# Patient Record
Sex: Male | Born: 1985 | Race: White | Hispanic: No | Marital: Married | Smoking: Never smoker
Health system: Southern US, Community
[De-identification: ages and names within clinical notes are randomized; demographics above are authoritative.]

## PROBLEM LIST (undated history)

## (undated) DIAGNOSIS — F329 Major depressive disorder, single episode, unspecified: Secondary | ICD-10-CM

## (undated) DIAGNOSIS — F32A Depression, unspecified: Secondary | ICD-10-CM

## (undated) HISTORY — DX: Morbid (severe) obesity due to excess calories: E66.01

## (undated) HISTORY — DX: Depression, unspecified: F32.A

## (undated) HISTORY — DX: Major depressive disorder, single episode, unspecified: F32.9

---
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
## 2012-12-11 ENCOUNTER — Emergency Department (HOSPITAL_COMMUNITY)
Admission: EM | Admit: 2012-12-11 | Discharge: 2012-12-11 | Disposition: A | Discharge status: Home / Self Care | Payer: Self-pay | Attending: Emergency Medicine | Admitting: Emergency Medicine

## 2012-12-11 ENCOUNTER — Encounter (HOSPITAL_COMMUNITY): Payer: Self-pay | Admitting: Emergency Medicine

## 2012-12-11 ENCOUNTER — Emergency Department (HOSPITAL_COMMUNITY): Payer: Self-pay

## 2012-12-11 DIAGNOSIS — S2239XA Fracture of one rib, unspecified side, initial encounter for closed fracture: Secondary | ICD-10-CM | POA: Insufficient documentation

## 2012-12-11 DIAGNOSIS — S2232XA Fracture of one rib, left side, initial encounter for closed fracture: Secondary | ICD-10-CM

## 2012-12-11 DIAGNOSIS — Y9389 Activity, other specified: Secondary | ICD-10-CM | POA: Insufficient documentation

## 2012-12-11 DIAGNOSIS — Y92410 Unspecified street and highway as the place of occurrence of the external cause: Secondary | ICD-10-CM | POA: Insufficient documentation

## 2012-12-11 MED ORDER — OXYCODONE-ACETAMINOPHEN 5-325 MG PO TABS: 1.0000 | ORAL_TABLET | Freq: Four times a day (QID) | ORAL | Status: DC | PRN

## 2012-12-11 MED ORDER — OXYCODONE-ACETAMINOPHEN 5-325 MG PO TABS
2.0000 | ORAL_TABLET | Freq: Once | ORAL | Status: AC
  Administered 2012-12-11: 2 via ORAL
  Filled 2012-12-11: qty 2
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
## 2012-12-12 NOTE — ED Provider Notes (Signed)
Medical screening examination/treatment/procedure(s) were performed by non-physician practitioner and as supervising physician I was immediately available for consultation/collaboration.   Kayla Deshaies E Autry Droege, MD 12/12/12 1409 

## 2014-03-22 ENCOUNTER — Emergency Department (HOSPITAL_COMMUNITY)
Admission: EM | Admit: 2014-03-22 | Discharge: 2014-03-22 | Disposition: A | Discharge status: Home / Self Care | Payer: Self-pay | Attending: Emergency Medicine | Admitting: Emergency Medicine

## 2014-03-22 ENCOUNTER — Encounter (HOSPITAL_COMMUNITY): Payer: Self-pay | Admitting: Emergency Medicine

## 2014-03-22 DIAGNOSIS — Y9389 Activity, other specified: Secondary | ICD-10-CM | POA: Insufficient documentation

## 2014-03-22 DIAGNOSIS — T2111XA Burn of first degree of chest wall, initial encounter: Secondary | ICD-10-CM | POA: Insufficient documentation

## 2014-03-22 DIAGNOSIS — T23009A Burn of unspecified degree of unspecified hand, unspecified site, initial encounter: Secondary | ICD-10-CM | POA: Insufficient documentation

## 2014-03-22 DIAGNOSIS — T23272A Burn of second degree of left wrist, initial encounter: Secondary | ICD-10-CM

## 2014-03-22 DIAGNOSIS — Y9289 Other specified places as the place of occurrence of the external cause: Secondary | ICD-10-CM | POA: Insufficient documentation

## 2014-03-22 DIAGNOSIS — T23279A Burn of second degree of unspecified wrist, initial encounter: Secondary | ICD-10-CM | POA: Insufficient documentation

## 2014-03-22 DIAGNOSIS — X131XXA Other contact with steam and other hot vapors, initial encounter: Secondary | ICD-10-CM

## 2014-03-22 DIAGNOSIS — Z79899 Other long term (current) drug therapy: Secondary | ICD-10-CM | POA: Insufficient documentation

## 2014-03-22 DIAGNOSIS — X12XXXA Contact with other hot fluids, initial encounter: Secondary | ICD-10-CM | POA: Insufficient documentation

## 2014-03-22 MED ORDER — OXYCODONE-ACETAMINOPHEN 5-325 MG PO TABS
1.0000 | ORAL_TABLET | Freq: Once | ORAL | Status: AC
  Administered 2014-03-22: 1 via ORAL
  Filled 2014-03-22: qty 1

## 2014-03-22 MED ORDER — SILVER SULFADIAZINE 1 % EX CREA
TOPICAL_CREAM | Freq: Once | CUTANEOUS | Status: AC
  Administered 2014-03-22: 1 via TOPICAL
  Filled 2014-03-22: qty 85

## 2014-03-22 MED ORDER — TETANUS-DIPHTH-ACELL PERTUSSIS 5-2.5-18.5 LF-MCG/0.5 IM SUSP
0.5000 mL | Freq: Once | INTRAMUSCULAR | Status: DC
  Filled 2014-03-22: qty 0.5

## 2014-03-22 MED ORDER — OXYCODONE-ACETAMINOPHEN 5-325 MG PO TABS: 1.0000 | ORAL_TABLET | Freq: Four times a day (QID) | ORAL | Status: DC | PRN

## 2014-03-22 MED ORDER — SILVER SULFADIAZINE 1 % EX CREA
1.0000 | TOPICAL_CREAM | Freq: Every day | CUTANEOUS | Status: DC
Start: 2014-03-22 — End: 2014-06-23

## 2014-03-22 MED ORDER — LIDOCAINE HCL 0.5 % EX GEL: CUTANEOUS | Status: DC

## 2014-03-22 MED ORDER — VITAMINS A & D EX OINT: 1.0000 "application " | TOPICAL_OINTMENT | CUTANEOUS | Status: DC | PRN
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
## 2014-03-23 NOTE — ED Provider Notes (Signed)
Medical screening examination/treatment/procedure(s) were performed by non-physician practitioner and as supervising physician I was immediately available for consultation/collaboration.   EKG Interpretation None        Elwin Mocha, MD 03/23/14 [phone]

## 2014-06-23 ENCOUNTER — Encounter (HOSPITAL_COMMUNITY): Payer: Self-pay | Admitting: *Deleted

## 2014-06-23 ENCOUNTER — Emergency Department (INDEPENDENT_AMBULATORY_CARE_PROVIDER_SITE_OTHER)
Admission: EM | Admit: 2014-06-23 | Discharge: 2014-06-23 | Disposition: A | Discharge status: Home / Self Care | Payer: Self-pay | Source: Home / Self Care | Attending: Emergency Medicine | Admitting: Emergency Medicine

## 2014-06-23 DIAGNOSIS — R0982 Postnasal drip: Secondary | ICD-10-CM

## 2014-06-23 DIAGNOSIS — J4520 Mild intermittent asthma, uncomplicated: Secondary | ICD-10-CM

## 2014-06-23 DIAGNOSIS — J069 Acute upper respiratory infection, unspecified: Secondary | ICD-10-CM

## 2014-06-23 MED ORDER — ALBUTEROL SULFATE (2.5 MG/3ML) 0.083% IN NEBU
INHALATION_SOLUTION | RESPIRATORY_TRACT | Status: AC
  Filled 2014-06-23: qty 3

## 2014-06-23 MED ORDER — TRIAMCINOLONE ACETONIDE 40 MG/ML IJ SUSP
60.0000 mg | Freq: Once | INTRAMUSCULAR | Status: AC
  Administered 2014-06-23: 60 mg via INTRAMUSCULAR

## 2014-06-23 MED ORDER — ALBUTEROL SULFATE HFA 108 (90 BASE) MCG/ACT IN AERS: 2.0000 | INHALATION_SPRAY | RESPIRATORY_TRACT | Status: DC | PRN

## 2014-06-23 MED ORDER — IPRATROPIUM-ALBUTEROL 0.5-2.5 (3) MG/3ML IN SOLN
RESPIRATORY_TRACT | Status: AC
  Filled 2014-06-23: qty 3

## 2014-06-23 MED ORDER — PREDNISONE 20 MG PO TABS: ORAL_TABLET | ORAL | Status: DC

## 2014-06-23 MED ORDER — IPRATROPIUM-ALBUTEROL 0.5-2.5 (3) MG/3ML IN SOLN
3.0000 mL | Freq: Once | RESPIRATORY_TRACT | Status: AC
  Administered 2014-06-23: 3 mL via RESPIRATORY_TRACT

## 2014-06-23 MED ORDER — ALBUTEROL SULFATE (2.5 MG/3ML) 0.083% IN NEBU
2.5000 mg | INHALATION_SOLUTION | Freq: Once | RESPIRATORY_TRACT | Status: AC
  Administered 2014-06-23: 2.5 mg via RESPIRATORY_TRACT

## 2014-06-23 MED ORDER — TRIAMCINOLONE ACETONIDE 40 MG/ML IJ SUSP
INTRAMUSCULAR | Status: AC
  Filled 2014-06-23: qty 2

## 2014-06-23 NOTE — Discharge Instructions (Signed)
Bronchospasm A bronchospasm is a spasm or tightening of the airways going into the lungs. During a bronchospasm breathing becomes more difficult because the airways get smaller. When this happens there can be coughing, a whistling sound when breathing (wheezing), and difficulty breathing. Bronchospasm is often associated with asthma, but not all patients who experience a bronchospasm have asthma. CAUSES  A bronchospasm is caused by inflammation or irritation of the airways. The inflammation or irritation may be triggered by:   Allergies (such as to animals, pollen, food, or mold). Allergens that cause bronchospasm may cause wheezing immediately after exposure or many hours later.   Infection. Viral infections are believed to be the most common cause of bronchospasm.   Exercise.   Irritants (such as pollution, cigarette smoke, strong odors, aerosol sprays, and paint fumes).   Weather changes. Winds increase molds and pollens in the air. Rain refreshes the air by washing irritants out. Cold air may cause inflammation.   Stress and emotional upset.  SIGNS AND SYMPTOMS   Wheezing.   Excessive nighttime coughing.   Frequent or severe coughing with a simple cold.   Chest tightness.   Shortness of breath.  DIAGNOSIS  Bronchospasm is usually diagnosed through a history and physical exam. Tests, such as chest X-rays, are sometimes done to look for other conditions. TREATMENT   Inhaled medicines can be given to open up your airways and help you breathe. The medicines can be given using either an inhaler or a nebulizer machine.  Corticosteroid medicines may be given for severe bronchospasm, usually when it is associated with asthma. HOME CARE INSTRUCTIONS   Always have a plan prepared for seeking medical care. Know when to call your health care provider and local emergency services (911 in the [place] Know where you can access local emergency care.  Only take medicines as  directed by your health care provider.  If you were prescribed an inhaler or nebulizer machine, ask your health care provider to explain how to use it correctly. Always use a spacer with your inhaler if you were given one.  It is necessary to remain calm during an attack. Try to relax and breathe more slowly.  Control your home environment in the following ways:   Change your heating and air conditioning filter at least once a month.   Limit your use of fireplaces and wood stoves.  Do not smoke and do not allow smoking in your home.   Avoid exposure to perfumes and fragrances.   Get rid of pests (such as roaches and mice) and their droppings.   Throw away plants if you see mold on them.   Keep your house clean and dust free.   Replace carpet with wood, tile, or vinyl flooring. Carpet can trap dander and dust.   Use allergy-proof pillows, mattress covers, and box spring covers.   Wash bed sheets and blankets every week in hot water and dry them in a dryer.   Use blankets that are made of polyester or cotton.   Wash hands frequently. SEEK MEDICAL CARE IF:   You have muscle aches.   You have chest pain.   The sputum changes from clear or white to yellow, green, gray, or bloody.   The sputum you cough up gets thicker.   There are problems that may be related to the medicine you are given, such as a rash, itching, swelling, or trouble breathing.  SEEK IMMEDIATE MEDICAL CARE IF:   You have worsening wheezing and coughing even  after taking your prescribed medicines.   You have increased difficulty breathing.   You develop severe chest pain. MAKE SURE YOU:   Understand these instructions.  Will watch your condition.  Will get help right away if you are not doing well or get worse. Document Released: 07/14/2003 Document Revised: 07/16/2013 Document Reviewed: 12/31/2012 [facility] Patient Information 2015 [place], [place]. This information is not  intended to replace advice given to you by your health care provider. Make sure you discuss any questions you have with your health care provider.  How to Use an Inhaler Using your inhaler correctly is very important. Good technique will make sure that the medicine reaches your lungs.  HOW TO USE AN INHALER:  Take the cap off the inhaler.  If this is the first time using your inhaler, you need to prime it. Shake the inhaler for 5 seconds. Release four puffs into the air, away from your face. Ask your doctor for help if you have questions.  Shake the inhaler for 5 seconds.  Turn the inhaler so the bottle is above the mouthpiece.  Put your pointer finger on top of the bottle. Your thumb holds the bottom of the inhaler.  Open your mouth.  Either hold the inhaler away from your mouth (the width of 2 fingers) or place your lips tightly around the mouthpiece. Ask your doctor which way to use your inhaler.  Breathe out as much air as possible.  Breathe in and push down on the bottle 1 time to release the medicine. You will feel the medicine go in your mouth and throat.  Continue to take a deep breath in very slowly. Try to fill your lungs.  After you have breathed in completely, hold your breath for 10 seconds. This will help the medicine to settle in your lungs. If you cannot hold your breath for 10 seconds, hold it for as long as you can before you breathe out.  Breathe out slowly, through pursed lips. Whistling is an example of pursed lips.  If your doctor has told you to take more than 1 puff, wait at least 15-30 seconds between puffs. This will help you get the best results from your medicine. Do not use the inhaler more than your doctor tells you to.  Put the cap back on the inhaler.  Follow the directions from your doctor or from the inhaler package about cleaning the inhaler. If you use more than one inhaler, ask your doctor which inhalers to use and what order to use them in. Ask  your doctor to help you figure out when you will need to refill your inhaler.  If you use a steroid inhaler, always rinse your mouth with water after your last puff, gargle and spit out the water. Do not swallow the water. GET HELP IF:  The inhaler medicine only partially helps to stop wheezing or shortness of breath.  You are having trouble using your inhaler.  You have some increase in thick spit (phlegm). GET HELP RIGHT AWAY IF:  The inhaler medicine does not help your wheezing or shortness of breath or you have tightness in your chest.  You have dizziness, headaches, or fast heart rate.  You have chills, fever, or night sweats.  You have a large increase of thick spit, or your thick spit is bloody. MAKE SURE YOU:   Understand these instructions.  Will watch your condition.  Will get help right away if you are not doing well or get worse. Document  Released: 04/19/2008 Document Revised: 05/01/2013 Document Reviewed: 02/07/2013 [facility] Patient Information 2015 [place], [place]. This information is not intended to replace advice given to you by your health care provider. Make sure you discuss any questions you have with your health care provider.  Upper Respiratory Infection, Adult OTC cold meds such as Nyquil or Alkaseltzer Cold. Lots of fluids Rest For high fevers, worsening breathing, feeling worse return or go to the emergency department An upper respiratory infection (URI) is also sometimes known as the common cold. The upper respiratory tract includes the nose, sinuses, throat, trachea, and bronchi. Bronchi are the airways leading to the lungs. Most people improve within 1 week, but symptoms can last up to 2 weeks. A residual cough may last even longer.  CAUSES Many different viruses can infect the tissues lining the upper respiratory tract. The tissues become irritated and inflamed and often become very moist. Mucus production is also common. A cold is contagious. You can  easily spread the virus to others by oral contact. This includes kissing, sharing a glass, coughing, or sneezing. Touching your mouth or nose and then touching a surface, which is then touched by another person, can also spread the virus. SYMPTOMS  Symptoms typically develop 1 to 3 days after you come in contact with a cold virus. Symptoms vary from person to person. They may include:  Runny nose.  Sneezing.  Nasal congestion.  Sinus irritation.  Sore throat.  Loss of voice (laryngitis).  Cough.  Fatigue.  Muscle aches.  Loss of appetite.  Headache.  Low-grade fever. DIAGNOSIS  You might diagnose your own cold based on familiar symptoms, since most people get a cold 2 to 3 times a year. Your caregiver can confirm this based on your exam. Most importantly, your caregiver can check that your symptoms are not due to another disease such as strep throat, sinusitis, pneumonia, asthma, or epiglottitis. Blood tests, throat tests, and X-rays are not necessary to diagnose a common cold, but they may sometimes be helpful in excluding other more serious diseases. Your caregiver will decide if any further tests are required. RISKS AND COMPLICATIONS  You may be at risk for a more severe case of the common cold if you smoke cigarettes, have chronic heart disease (such as heart failure) or lung disease (such as asthma), or if you have a weakened immune system. The very young and very old are also at risk for more serious infections. Bacterial sinusitis, middle ear infections, and bacterial pneumonia can complicate the common cold. The common cold can worsen asthma and chronic obstructive pulmonary disease (COPD). Sometimes, these complications can require emergency medical care and may be life-threatening. PREVENTION  The best way to protect against getting a cold is to practice good hygiene. Avoid oral or hand contact with people with cold symptoms. Wash your hands often if contact occurs. There is  no clear evidence that vitamin C, vitamin E, echinacea, or exercise reduces the chance of developing a cold. However, it is always recommended to get plenty of rest and practice good nutrition. TREATMENT  Treatment is directed at relieving symptoms. There is no cure. Antibiotics are not effective, because the infection is caused by a virus, not by bacteria. Treatment may include:  Increased fluid intake. Sports drinks offer valuable electrolytes, sugars, and fluids.  Breathing heated mist or steam (vaporizer or shower).  Eating chicken soup or other clear broths, and maintaining good nutrition.  Getting plenty of rest.  Using gargles or lozenges for comfort.  Controlling fevers with ibuprofen or acetaminophen as directed by your caregiver.  Increasing usage of your inhaler if you have asthma. Zinc gel and zinc lozenges, taken in the first 24 hours of the common cold, can shorten the duration and lessen the severity of symptoms. Pain medicines may help with fever, muscle aches, and throat pain. A variety of non-prescription medicines are available to treat congestion and runny nose. Your caregiver can make recommendations and may suggest nasal or lung inhalers for other symptoms.  HOME CARE INSTRUCTIONS   Only take over-the-counter or prescription medicines for pain, discomfort, or fever as directed by your caregiver.  Use a warm mist humidifier or inhale steam from a shower to increase air moisture. This may keep secretions moist and make it easier to breathe.  Drink enough water and fluids to keep your urine clear or pale yellow.  Rest as needed.  Return to work when your temperature has returned to normal or as your caregiver advises. You may need to stay home longer to avoid infecting others. You can also use a face mask and careful hand washing to prevent spread of the virus. SEEK MEDICAL CARE IF:   After the first few days, you feel you are getting worse rather than better.  You  need your caregiver's advice about medicines to control symptoms.  You develop chills, worsening shortness of breath, or brown or red sputum. These may be signs of pneumonia.  You develop yellow or brown nasal discharge or pain in the face, especially when you bend forward. These may be signs of sinusitis.  You develop a fever, swollen neck glands, pain with swallowing, or white areas in the back of your throat. These may be signs of strep throat. SEEK IMMEDIATE MEDICAL CARE IF:   You have a fever.  You develop severe or persistent headache, ear pain, sinus pain, or chest pain.  You develop wheezing, a prolonged cough, cough up blood, or have a change in your usual mucus (if you have chronic lung disease).  You develop sore muscles or a stiff neck. Document Released: 01/04/2001 Document Revised: 10/03/2011 Document Reviewed: 10/16/2013 [facility] Patient Information 2015 [place], [place]. This information is not intended to replace advice given to you by your health care provider. Make sure you discuss any questions you have with your health care provider.

## 2014-06-23 NOTE — ED Notes (Signed)
Pt     Reports  Symptoms    Of  Cough   Congestion     sorethroat     With    Body          Aches    Chills         Stuffy  Nose   X  3-4    Days       Pt  Ambulated  To the  Room  With a  Steady  Fluid  Gait

## 2014-06-23 NOTE — ED Provider Notes (Signed)
CSN: [id number]     Arrival date & time 06/23/14  1106 History   First MD Initiated Contact with Patient 06/23/14 1214     Chief Complaint  Patient presents with  . URI   (Consider location/radiation/quality/duration/timing/severity/associated sxs/prior Treatment) Patient is a 28 y.o. male presenting with URI.  URI Presenting symptoms: congestion, cough, ear pain, fatigue, rhinorrhea and sore throat   Presenting symptoms: no fever   Severity:  Moderate Onset quality:  Gradual Duration:  4 days Timing:  Constant Progression:  Worsening Chronicity:  New Relieved by:  Nothing Worsened by:  Nothing tried Ineffective treatments:  OTC medications Associated symptoms: no headaches, no neck pain and no swollen glands     History reviewed. No pertinent past medical history. History reviewed. No pertinent past surgical history. History reviewed. No pertinent family history. History  Substance Use Topics  . Smoking status: Never Smoker   . Smokeless tobacco: Not on file     Comment: vapor  . Alcohol Use: Yes     Comment: occ    Review of Systems  Constitutional: Positive for fatigue. Negative for fever.  HENT: Positive for congestion, ear pain, postnasal drip, rhinorrhea and sore throat.   Respiratory: Positive for cough, chest tightness and shortness of breath.   Gastrointestinal: Negative.   Musculoskeletal: Negative for neck pain.  Neurological: Negative for headaches.    Allergies  Review of patient's allergies indicates no known allergies.  Home Medications   Prior to Admission medications   Medication Sig Start Date End Date Taking? Authorizing Provider  albuterol (PROVENTIL HFA;VENTOLIN HFA) 108 (90 BASE) MCG/ACT inhaler Inhale 2 puffs into the lungs every 4 (four) hours as needed for wheezing or shortness of breath. 06/23/14   Hayden Rasmussenavid Lizza Huffaker, NP  predniSONE (DELTASONE) 20 MG tablet Take 3 tabs po on first day, 2 tabs second day, 2 tabs third day, 1 tab fourth day, 1 tab  5th day. Take with food. Start 06/24/14 06/23/14   Hayden Rasmussenavid Nomar Broad, NP   BP 104/72 mmHg  Pulse 96  Temp(Src) 98.4 F (36.9 C) (Oral)  Resp 16  SpO2 96% Physical Exam  Constitutional: He is oriented to person, place, and time. He appears well-developed and well-nourished. No distress.  HENT:  Mouth/Throat: No oropharyngeal exudate.  Bilat TM's retracted, light erythema. OP with minor erythema and clear PND  Eyes: Conjunctivae and EOM are normal.  Neck: Normal range of motion. Neck supple.  Cardiovascular: Normal rate, regular rhythm and normal heart sounds.   Pulmonary/Chest: Effort normal. No respiratory distress. He has wheezes. He has no rales.  Musculoskeletal: Normal range of motion. He exhibits no edema.  Lymphadenopathy:    He has no cervical adenopathy.  Neurological: He is alert and oriented to person, place, and time.  Skin: Skin is warm and dry. No rash noted.  Psychiatric: He has a normal mood and affect.  Nursing note and vitals reviewed.   ED Course  Procedures (including critical care time) Labs Review Labs Reviewed - No data to display  Imaging Review No results found.   MDM   1. URI (upper respiratory infection)   2. RAD (reactive airway disease) with wheezing, mild intermittent, uncomplicated   3. PND (post-nasal drip)     duoneb 5 mg. Much improved post neb. Lungs clear.  Albuterol HFA Kenalog 60 mg IM Prednisone taper OTC meds for URI sx's Plenty of fluids OTC cold meds such as Nyquil or Alkaseltzer Cold. Lots of fluids Rest For high fevers, worsening breathing, feeling  worse return or go to the emergency department    Hayden Rasmussenavid Franki Stemen, NP 06/23/14 1309

## 2014-06-28 ENCOUNTER — Encounter (HOSPITAL_COMMUNITY): Payer: Self-pay | Admitting: Emergency Medicine

## 2014-06-28 ENCOUNTER — Ambulatory Visit (HOSPITAL_COMMUNITY): Payer: Self-pay | Attending: Emergency Medicine

## 2014-06-28 ENCOUNTER — Emergency Department (INDEPENDENT_AMBULATORY_CARE_PROVIDER_SITE_OTHER)
Admission: EM | Admit: 2014-06-28 | Discharge: 2014-06-28 | Disposition: A | Discharge status: Home / Self Care | Payer: Self-pay | Source: Home / Self Care | Attending: Emergency Medicine | Admitting: Emergency Medicine

## 2014-06-28 DIAGNOSIS — R05 Cough: Secondary | ICD-10-CM | POA: Insufficient documentation

## 2014-06-28 DIAGNOSIS — H66003 Acute suppurative otitis media without spontaneous rupture of ear drum, bilateral: Secondary | ICD-10-CM

## 2014-06-28 DIAGNOSIS — J209 Acute bronchitis, unspecified: Secondary | ICD-10-CM

## 2014-06-28 DIAGNOSIS — R059 Cough, unspecified: Secondary | ICD-10-CM

## 2014-06-28 DIAGNOSIS — R079 Chest pain, unspecified: Secondary | ICD-10-CM | POA: Insufficient documentation

## 2014-06-28 DIAGNOSIS — H6690 Otitis media, unspecified, unspecified ear: Secondary | ICD-10-CM | POA: Insufficient documentation

## 2014-06-28 DIAGNOSIS — R067 Sneezing: Secondary | ICD-10-CM | POA: Insufficient documentation

## 2014-06-28 MED ORDER — AMOXICILLIN 500 MG PO CAPS: 1000.0000 mg | ORAL_CAPSULE | Freq: Three times a day (TID) | ORAL | Status: DC

## 2014-06-28 MED ORDER — GUAIFENESIN-CODEINE 100-10 MG/5ML PO SYRP: 5.0000 mL | ORAL_SOLUTION | Freq: Three times a day (TID) | ORAL | Status: DC | PRN

## 2014-06-28 MED ORDER — IPRATROPIUM BROMIDE 0.06 % NA SOLN: 2.0000 | Freq: Four times a day (QID) | NASAL | Status: DC

## 2014-06-28 NOTE — Discharge Instructions (Signed)
Acute Bronchitis Bronchitis is inflammation of the airways that extend from the windpipe into the lungs (bronchi). The inflammation often causes mucus to develop. This leads to a cough, which is the most common symptom of bronchitis.  In acute bronchitis, the condition usually develops suddenly and goes away over time, usually in a couple weeks. Smoking, allergies, and asthma can make bronchitis worse. Repeated episodes of bronchitis may cause further lung problems.  CAUSES Acute bronchitis is most often caused by the same virus that causes a cold. The virus can spread from person to person (contagious) through coughing, sneezing, and touching contaminated objects. SIGNS AND SYMPTOMS   Cough.   Fever.   Coughing up mucus.   Body aches.   Chest congestion.   Chills.   Shortness of breath.   Sore throat.  DIAGNOSIS  Acute bronchitis is usually diagnosed through a physical exam. Your health care provider will also ask you questions about your medical history. Tests, such as chest X-rays, are sometimes done to rule out other conditions.  TREATMENT  Acute bronchitis usually goes away in a couple weeks. Oftentimes, no medical treatment is necessary. Medicines are sometimes given for relief of fever or cough. Antibiotic medicines are usually not needed but may be prescribed in certain situations. In some cases, an inhaler may be recommended to help reduce shortness of breath and control the cough. A cool mist vaporizer may also be used to help thin bronchial secretions and make it easier to clear the chest.  HOME CARE INSTRUCTIONS  Get plenty of rest.   Drink enough fluids to keep your urine clear or pale yellow (unless you have a medical condition that requires fluid restriction). Increasing fluids may help thin your respiratory secretions (sputum) and reduce chest congestion, and it will prevent dehydration.   Take medicines only as directed by your health care provider.  If  you were prescribed an antibiotic medicine, finish it all even if you start to feel better.  Avoid smoking and secondhand smoke. Exposure to cigarette smoke or irritating chemicals will make bronchitis worse. If you are a smoker, consider using nicotine gum or skin patches to help control withdrawal symptoms. Quitting smoking will help your lungs heal faster.   Reduce the chances of another bout of acute bronchitis by washing your hands frequently, avoiding people with cold symptoms, and trying not to touch your hands to your mouth, nose, or eyes.   Keep all follow-up visits as directed by your health care provider.  SEEK MEDICAL CARE IF: Your symptoms do not improve after 1 week of treatment.  SEEK IMMEDIATE MEDICAL CARE IF:  You develop an increased fever or chills.   You have chest pain.   You have severe shortness of breath.  You have bloody sputum.   You develop dehydration.  You faint or repeatedly feel like you are going to pass out.  You develop repeated vomiting.  You develop a severe headache. MAKE SURE YOU:   Understand these instructions.  Will watch your condition.  Will get help right away if you are not doing well or get worse. Document Released: 08/18/2004 Document Revised: 11/25/2013 Document Reviewed: 01/01/2013 [facility] Patient Information 2015 [place], [place]. This information is not intended to replace advice given to you by your health care provider. Make sure you discuss any questions you have with your health care provider.  Otitis Media Otitis media is redness, soreness, and inflammation of the middle ear. Otitis media may be caused by allergies or, most commonly,  by infection. Often it occurs as a complication of the common cold. SIGNS AND SYMPTOMS Symptoms of otitis media may include:  Earache.  Fever.  Ringing in your ear.  Headache.  Leakage of fluid from the ear. DIAGNOSIS To diagnose otitis media, your health care provider will  examine your ear with an otoscope. This is an instrument that allows your health care provider to see into your ear in order to examine your eardrum. Your health care provider also will ask you questions about your symptoms. TREATMENT  Typically, otitis media resolves on its own within 3-5 days. Your health care provider may prescribe medicine to ease your symptoms of pain. If otitis media does not resolve within 5 days or is recurrent, your health care provider may prescribe antibiotic medicines if he or she suspects that a bacterial infection is the cause. HOME CARE INSTRUCTIONS   If you were prescribed an antibiotic medicine, finish it all even if you start to feel better.  Take medicines only as directed by your health care provider.  Keep all follow-up visits as directed by your health care provider. SEEK MEDICAL CARE IF:  You have otitis media only in one ear, or bleeding from your nose, or both.  You notice a lump on your neck.  You are not getting better in 3-5 days.  You feel worse instead of better. SEEK IMMEDIATE MEDICAL CARE IF:   You have pain that is not controlled with medicine.  You have swelling, redness, or pain around your ear or stiffness in your neck.  You notice that part of your face is paralyzed.  You notice that the bone behind your ear (mastoid) is tender when you touch it. MAKE SURE YOU:   Understand these instructions.  Will watch your condition.  Will get help right away if you are not doing well or get worse. Document Released: 04/15/2004 Document Revised: 11/25/2013 Document Reviewed: 02/05/2013 [facility] Patient Information 2015 [place], [place]. This information is not intended to replace advice given to you by your health care provider. Make sure you discuss any questions you have with your health care provider.

## 2014-06-28 NOTE — ED Notes (Signed)
C/o bilateral ear pain; seen here on 11/30 for cold sx Given albuterol and prednisone to take home; feels "somewhat" better but both ears hurt when he coughs Denies f/v/n/d Alert, no signs of acute distress.

## 2014-06-29 NOTE — ED Provider Notes (Signed)
Chief Complaint   Otalgia   History of Present Illness   Gregory Roach is a 28 year old male who has had a one-week history of cough, nasal congestion, and now has ear pain. The patient was here 6 days ago when this first began. His main symptoms at that time her cough. He was given an injection of corticosteroid, prednisone, and inhaler. The cough is about the same, not much better much worse. He is coughing up yellow-green sputum with flecks of blood. He's had wheezing and chest tightness but denies any chest pain. He's also had ongoing nasal congestion of clear yellow drainage, sinus pressure, and headache. His new problem is bilateral ear pain and ear congestion. There is no drainage from the ears. He denies fever or chills.  Review of Systems   Other than as noted above, the patient denies any of the following symptoms: Systemic:  No fevers, chills, sweats, or myalgias. Eye:  No redness or discharge. ENT:  No ear pain, headache, nasal congestion, drainage, sinus pressure, or sore throat. Neck:  No neck pain, stiffness, or swollen glands. Lungs:  No cough, sputum production, hemoptysis, wheezing, chest tightness, shortness of breath or chest pain. GI:  No abdominal pain, nausea, vomiting or diarrhea.  PMFSH   Past medical history, family history, social history, meds, and allergies were reviewed.   Physical exam   Vital signs:  BP 152/80 mmHg  Pulse 81  Temp(Src) 99.1 F (37.3 C) (Oral)  Resp 18  SpO2 95% General:  Alert and oriented.  In no distress.  Skin warm and dry. Eye:  No conjunctival injection or drainage. Lids were normal. ENT:  Both TMs were red, dull, and retracted.  Nasal mucosa was clear and uncongested, without drainage.  Mucous membranes were moist.  Pharynx was clear with no exudate or drainage.  There were no oral ulcerations or lesions. Neck:  Supple, no adenopathy, tenderness or mass. Lungs:  No respiratory distress.  He has inspiratory wheezes and rales  at the right base both anteriorly and posteriorly. Otherwise lungs were clear.  Heart:  Regular rhythm, without gallops, murmers or rubs. Skin:  Clear, warm, and dry, without rash or lesions.  Radiology   Dg Chest 2 View  06/28/2014   CLINICAL DATA:  Initial evaluation forPt states he has been sick for 2 weeks. Pt c/o sinus drainage, productive cough, sneezing, mid-sternal chest pain from coughing, and a double ear infection. Pt unsure of the color of his mucus. Pt went to an urgent care facility today where the doctor gave him albuterol, a breathing treatment, and two steroid medicines. Hx ex-smoker 3 years ago.  EXAM: CHEST  2 VIEW  COMPARISON:  11/2012  FINDINGS: The heart size and mediastinal contours are within normal limits. Both lungs are clear. The visualized skeletal structures are unremarkable.  IMPRESSION: No active cardiopulmonary disease.   Electronically Signed   By: Esperanza Heiraymond  Rubner M.D.   On: 06/28/2014 15:17    Assessment     The primary encounter diagnosis was Acute suppurative otitis media of both ears without spontaneous rupture of tympanic membranes, recurrence not specified. Diagnoses of Cough and Acute bronchitis, unspecified organism were also pertinent to this visit.  Plan    1.  Meds:  The following meds were prescribed:   Discharge Medication List as of 06/28/2014  3:26 PM    START taking these medications   Details  amoxicillin (AMOXIL) 500 MG capsule Take 2 capsules (1,000 mg total) by mouth 3 (three) times daily.,  Starting 06/28/2014, Until Discontinued, Normal    guaiFENesin-codeine (ROBITUSSIN AC) 100-10 MG/5ML syrup Take 5 mLs by mouth 3 (three) times daily as needed for cough., Starting 06/28/2014, Until Discontinued, Print    ipratropium (ATROVENT) 0.06 % nasal spray Place 2 sprays into both nostrils 4 (four) times daily., Starting 06/28/2014, Until Discontinued, Normal        2.  Patient Education/Counseling:  The patient was given appropriate handouts,  self care instructions, and instructed in symptomatic relief.  Instructed to get extra fluids and extra rest.    3.  Follow up:  The patient was told to follow up here if no better in 3 to 4 days, or sooner if becoming worse in any way, and given some red flag symptoms such as increasing fever, difficulty breathing, chest pain, or persistent vomiting which would prompt immediate return.       Reuben Likesavid C Nikkol Pai, MD 06/29/14 [phone]

## 2014-12-05 ENCOUNTER — Encounter (HOSPITAL_COMMUNITY): Payer: Self-pay | Admitting: Emergency Medicine

## 2014-12-05 ENCOUNTER — Emergency Department (INDEPENDENT_AMBULATORY_CARE_PROVIDER_SITE_OTHER)
Admission: EM | Admit: 2014-12-05 | Discharge: 2014-12-05 | Disposition: A | Discharge status: Home / Self Care | Payer: Self-pay | Source: Home / Self Care | Attending: Family Medicine | Admitting: Family Medicine

## 2014-12-05 DIAGNOSIS — M76822 Posterior tibial tendinitis, left leg: Secondary | ICD-10-CM

## 2014-12-05 DIAGNOSIS — M79662 Pain in left lower leg: Secondary | ICD-10-CM

## 2014-12-05 NOTE — ED Provider Notes (Signed)
CSN: [id number]     Arrival date & time 12/05/14  1754 History   First MD Initiated Contact with Patient 12/05/14 1837     Chief Complaint  Patient presents with  . Leg Pain   (Consider location/radiation/quality/duration/timing/severity/associated sxs/prior Treatment) HPI Comments: 29 year old obese male complaining of pain in the left leg 3-4 months. States it occurs on almost a daily basis. The pain is located in the popliteal fossa, proximal calf and along the calf and anterior tibia. No known injury. His job requires standing for up to 10 hours a day. Then he goes home and sits. He has had no swelling or redness.   History reviewed. No pertinent past medical history. History reviewed. No pertinent past surgical history. History reviewed. No pertinent family history. History  Substance Use Topics  . Smoking status: Never Smoker   . Smokeless tobacco: Not on file     Comment: vapor  . Alcohol Use: Yes     Comment: occ    Review of Systems  Constitutional: Negative for fever, activity change and fatigue.  Respiratory: Negative.  Negative for cough and shortness of breath.   Cardiovascular: Negative for chest pain.  Gastrointestinal: Negative.   Genitourinary: Negative.   Musculoskeletal: Positive for myalgias. Negative for back pain.  Skin: Negative.   Neurological: Positive for numbness.  Hematological: Does not bruise/bleed easily.    Allergies  Review of patient's allergies indicates no known allergies.  Home Medications   Prior to Admission medications   Medication Sig Start Date End Date Taking? Authorizing Provider  albuterol (PROVENTIL HFA;VENTOLIN HFA) 108 (90 BASE) MCG/ACT inhaler Inhale 2 puffs into the lungs every 4 (four) hours as needed for wheezing or shortness of breath. 06/23/14   Hayden Rasmussenavid Makelle Marrone, NP  amoxicillin (AMOXIL) 500 MG capsule Take 2 capsules (1,000 mg total) by mouth 3 (three) times daily. 06/28/14   Reuben Likesavid C Keller, MD  guaiFENesin-codeine  [facility](ROBITUSSIN AC) 100-10 MG/5ML syrup Take 5 mLs by mouth 3 (three) times daily as needed for cough. 06/28/14   Reuben Likesavid C Keller, MD  ipratropium (ATROVENT) 0.06 % nasal spray Place 2 sprays into both nostrils 4 (four) times daily. 06/28/14   Reuben Likesavid C Keller, MD  predniSONE (DELTASONE) 20 MG tablet Take 3 tabs po on first day, 2 tabs second day, 2 tabs third day, 1 tab fourth day, 1 tab 5th day. Take with food. Start 06/24/14 06/23/14   Hayden Rasmussenavid Karlita Lichtman, NP   BP 125/75 mmHg  Pulse 93  Temp(Src) 97 F (36.1 C) (Oral)  Resp 16  SpO2 96% Physical Exam  Constitutional: He is oriented to person, place, and time. He appears well-developed and well-nourished. No distress.  Neck: Normal range of motion. Neck supple.  Cardiovascular: Normal rate.   Pulmonary/Chest: Effort normal. No respiratory distress.  Musculoskeletal: Normal range of motion. He exhibits no edema.  Examination of the left lower extremity reveals no edema, redness or increased warmth. There is full range of motion of the knee and ankle. There is tenderness to the semimembranosis muscle and tendon. Patient is able to stand on his toes without pain to the calf. Performing a straight leg raise produces pain to the proximal calf and along the associated tendons within the popliteal fossa. No Baker cyst is appreciated. Distal neurovascular motor sensory is intact. Pedal pulses 2+. Normal strength and muscle tone. Knee without pain or tenderness.  Neurological: He is alert and oriented to person, place, and time. He exhibits normal muscle tone.  Skin: Skin is warm  and dry. No rash noted. No erythema.  Psychiatric: He has a normal mood and affect.  Nursing note and vitals reviewed.   ED Course  Procedures (including critical care time) Labs Review Labs Reviewed - No data to display  Imaging Review No results found.   MDM   1. Posterior tibial tendinitis of left leg   2. Calf pain, left    Heat stretches and ROM for lower legs Rechk for  sighs of emboli as discussed. No signs today.      Hayden Rasmussenavid Jamieson Lisa, NP 12/05/14 [phone]

## 2014-12-05 NOTE — ED Notes (Signed)
Patient c/o Left lower extremity pain x 3 months. No injury or trauma. Patient reports it is warm to the touch. No pain with ROM only when lying still.

## 2014-12-05 NOTE — Discharge Instructions (Signed)

## 2015-12-07 ENCOUNTER — Other Ambulatory Visit (INDEPENDENT_AMBULATORY_CARE_PROVIDER_SITE_OTHER): Payer: BLUE CROSS/BLUE SHIELD

## 2015-12-07 ENCOUNTER — Ambulatory Visit (INDEPENDENT_AMBULATORY_CARE_PROVIDER_SITE_OTHER): Payer: BLUE CROSS/BLUE SHIELD | Admitting: Family

## 2015-12-07 ENCOUNTER — Telehealth: Payer: Self-pay | Admitting: Family

## 2015-12-07 ENCOUNTER — Encounter: Payer: Self-pay | Admitting: Family

## 2015-12-07 DIAGNOSIS — F909 Attention-deficit hyperactivity disorder, unspecified type: Secondary | ICD-10-CM

## 2015-12-07 DIAGNOSIS — F329 Major depressive disorder, single episode, unspecified: Secondary | ICD-10-CM | POA: Diagnosis not present

## 2015-12-07 DIAGNOSIS — F988 Other specified behavioral and emotional disorders with onset usually occurring in childhood and adolescence: Secondary | ICD-10-CM | POA: Insufficient documentation

## 2015-12-07 DIAGNOSIS — F32A Depression, unspecified: Secondary | ICD-10-CM | POA: Insufficient documentation

## 2015-12-07 LAB — BASIC METABOLIC PANEL
BUN: 12 mg/dL (ref 6–23)
CHLORIDE: 101 meq/L (ref 96–112)
CO2: 30 mEq/L (ref 19–32)
CREATININE: 0.93 mg/dL (ref 0.40–1.50)
Calcium: 9.7 mg/dL (ref 8.4–10.5)
GFR: 102.12 mL/min (ref >60.00)
GLUCOSE: 111 mg/dL — AB (ref 70–99)
Potassium: 4.4 mEq/L (ref 3.5–5.1)
Sodium: 139 mEq/L (ref 135–145)

## 2015-12-07 LAB — TSH: TSH: 2 u[IU]/mL (ref 0.35–4.50)

## 2015-12-07 LAB — HEMOGLOBIN A1C: Hgb A1c MFr Bld: 5.4 % (ref 4.6–6.5)

## 2015-12-07 MED ORDER — NALTREXONE-BUPROPION HCL ER 8-90 MG PO TB12: ORAL_TABLET | ORAL | Status: DC

## 2015-12-07 NOTE — Telephone Encounter (Signed)
Please inform patient that his blood work shows that his A1c is normal with no evidence of diabetes and his thyroid function, kidney function and electrolytes are all within the normal limits. Therefore please have him follow up in about 1 month as we discussed.

## 2015-12-07 NOTE — Progress Notes (Signed)
Subjective:    Patient ID: Gregory Roach, male    DOB: 30-Jun-1986, 30 y.o.   MRN: [id number]  Chief Complaint  Patient presents with  . Establish Care    wants to talk about weight, referral for add/adhd,     HPI:  Gregory Roach is a 30 y.o. male who  has a past medical history of Depression. and presents today for an office visit to establish care.   1.) Decreased concentration - Associated symptom of decreased concentration has been going on since around the age of 51. Describes some problems concentrating in school. He was diagnosed with ADD/ADHD. Notes that he does have difficulty completing tasks and seeing things through to completion. Modifying factors include medication which have helped in the past. Previously maintained on medication and wishing to return to taking medication with a request for referral to psychiatry as his symptoms have worsened.   2.) Weight issues - Associated symptom of weight issues have been waxing and waning for about the last 5-6 years. Modifying factors include adjustments to nutritional intake as well as physical activity. Changes in nutrition have been going on for the past 3 months including decreasing saturated fats and sugary/processed foods as well as changing water for soda has helped. Physical activity has also been increased which has helped him to lose about 15-20 pounds since initiation.  No Known Allergies   Outpatient Prescriptions Prior to Visit  Medication Sig Dispense Refill  . albuterol (PROVENTIL HFA;VENTOLIN HFA) 108 (90 BASE) MCG/ACT inhaler Inhale 2 puffs into the lungs every 4 (four) hours as needed for wheezing or shortness of breath. 1 Inhaler 0  . amoxicillin (AMOXIL) 500 MG capsule Take 2 capsules (1,000 mg total) by mouth 3 (three) times daily. 60 capsule 0  . guaiFENesin-codeine (ROBITUSSIN AC) 100-10 MG/5ML syrup Take 5 mLs by mouth 3 (three) times daily as needed for cough. 120 mL 0  . ipratropium (ATROVENT) 0.06 % nasal  spray Place 2 sprays into both nostrils 4 (four) times daily. 15 mL 12  . predniSONE (DELTASONE) 20 MG tablet Take 3 tabs po on first day, 2 tabs second day, 2 tabs third day, 1 tab fourth day, 1 tab 5th day. Take with food. Start 06/24/14 9 tablet 0   No facility-administered medications prior to visit.     Past Medical History  Diagnosis Date  . Depression      History reviewed. No pertinent past surgical history.   Family History  Problem Relation Age of Onset  . Arthritis Mother   . Asthma Mother   . Chiari malformation Mother   . Hyperlipidemia Father   . Heart disease Father   . Heart failure Father   . Hypertension Father   . Diabetes Maternal Grandmother   . Diabetes Maternal Grandfather   . Diabetes Paternal Grandmother   . Alzheimer's disease Paternal Grandmother      Social History   Social History  . Marital Status: Single    Spouse Name: N/A  . Number of Children: 0  . Years of Education: 14   Occupational History  . [employer] Machinist     Social History Main Topics  . Smoking status: Never Smoker   . Smokeless tobacco: Never Used     Comment: vapor  . Alcohol Use: Yes     Comment: occasionally - couple times per month  . Drug Use: No  . Sexual Activity: Not on file   Other Topics Concern  . Not on file  Social History Narrative   Fun: Go fishing, hiking, weight lifting.      Review of Systems  Constitutional: Negative for fever and chills.  Respiratory: Negative for chest tightness and shortness of breath.   Cardiovascular: Negative for chest pain, palpitations and leg swelling.      Objective:    BP 132/94 mmHg  Pulse 90  Temp(Src) 98.3 F (36.8 C) (Oral)  Resp 16  Ht 6' (1.829 m)  Wt 356 lb (161.481 kg)  BMI 48.27 kg/m2  SpO2 96% Nursing note and vital signs reviewed.  Physical Exam  Constitutional: He is oriented to person, place, and time. He appears well-developed and well-nourished. No distress.  Cardiovascular: Normal  rate, regular rhythm, normal heart sounds and intact distal pulses.   Pulmonary/Chest: Effort normal and breath sounds normal.  Neurological: He is alert and oriented to person, place, and time.  Skin: Skin is warm and dry.  Psychiatric: He has a normal mood and affect. His behavior is normal. Judgment and thought content normal.       Assessment & Plan:   Problem List Items Addressed This Visit      Other   Morbid obesity (HCC) - Primary    BMI of 48. Recommend weight loss approximate 5-10% of current body weight through lifestyle changes.Recommend increasing physical activity to 30 minutes of moderate level activity daily. Encourage nutritional intake that focuses on nutrient dense foods and is moderate, varied, and balanced and is low in saturated fats and processed/sugary foods. Start Contrave. Follow up in 1 month or sooner if needed.       Relevant Medications   Naltrexone-Bupropion HCl ER (CONTRAVE) 8-90 MG TB12   Other Relevant Orders   TSH   Hemoglobin A1c   Basic Metabolic Panel (BMET)   ADD (attention deficit disorder)    Previously diagnosed with ADD and would like referral to psychiatry for medication management. Referral placed.      Relevant Orders   Ambulatory referral to Psychiatry   Depression    Previously diagnosed with depression and maintained through psychiatry. Not currently maintained on medications and denies suicidal ideations or dysphoric mood. Refer to psychiatry per patient request.       Relevant Orders   Ambulatory referral to Psychiatry       I have discontinued Mr. Gregory Roach's albuterol, predniSONE, ipratropium, guaiFENesin-codeine, and amoxicillin. I am also having him start on Naltrexone-Bupropion HCl ER.   Meds ordered this encounter  Medications  . Naltrexone-Bupropion HCl ER (CONTRAVE) 8-90 MG TB12    Sig: One tablet by mouth once daily in the morning for 1 week; at week 2, increase to 1 tablet twice daily in the morning and evening and  continue for 1 week; at week 3, increase to 2 tablets in the morning and 1 tablet in the evening and continue for 1 week; at week 4, increase to 2 tablets twice daily in the morning and evening.    Dispense:  120 tablet    Refill:  0    Order Specific Question:  Supervising Provider    Answer:  Hillard DankerRAWFORD, ELIZABETH A [[id number]]     Follow-up: Return in about 1 month (around 01/07/2016).  Jeanine Luzalone, Skyleigh Windle, FNP

## 2015-12-07 NOTE — Progress Notes (Signed)
Pre visit review using our clinic review tool, if applicable. No additional management support is needed unless otherwise documented below in the visit note. 

## 2015-12-07 NOTE — Assessment & Plan Note (Signed)
Previously diagnosed with ADD and would like referral to psychiatry for medication management. Referral placed.

## 2015-12-07 NOTE — Assessment & Plan Note (Signed)
BMI of 48. Recommend weight loss approximate 5-10% of current body weight through lifestyle changes.Recommend increasing physical activity to 30 minutes of moderate level activity daily. Encourage nutritional intake that focuses on nutrient dense foods and is moderate, varied, and balanced and is low in saturated fats and processed/sugary foods. Start Contrave. Follow up in 1 month or sooner if needed.

## 2015-12-07 NOTE — Assessment & Plan Note (Signed)
Previously diagnosed with depression and maintained through psychiatry. Not currently maintained on medications and denies suicidal ideations or dysphoric mood. Refer to psychiatry per patient request.

## 2015-12-07 NOTE — Patient Instructions (Addendum)
Thank you for choosing [facility].  Summary/Instructions:  Your prescription(s) have been submitted to your pharmacy or been printed and provided for you. Please take as directed and contact our office if you believe you are having problem(s) with the medication(s) or have any questions.  Please stop by the lab on the basement level of the building for your blood work. Your results will be released to MyChart (or called to you) after review, usually within 72 hours after test completion. If any changes need to be made, you will be notified at that same time.  Referrals have been made during this visit. You should expect to hear back from our schedulers in about 7-10 days in regards to establishing an appointment with the specialists we discussed.   If your symptoms worsen or fail to improve, please contact our office for further instruction, or in case of emergency go directly to the emergency room at the closest medical facility.    Exercising to Lose Weight Exercising can help you to lose weight. In order to lose weight through exercise, you need to do vigorous-intensity exercise. You can tell that you are exercising with vigorous intensity if you are breathing very hard and fast and cannot hold a conversation while exercising. Moderate-intensity exercise helps to maintain your current weight. You can tell that you are exercising at a moderate level if you have a higher heart rate and faster breathing, but you are still able to hold a conversation. HOW OFTEN SHOULD I EXERCISE? Choose an activity that you enjoy and set realistic goals. Your health care provider can help you to make an activity plan that works for you. Exercise regularly as directed by your health care provider. This may include:  Doing resistance training twice each week, such as:  Push-ups.  Sit-ups.  Lifting weights.  Using resistance bands.  Doing a given intensity of exercise for a given amount of time.  Choose from these options:  150 minutes of moderate-intensity exercise every week.  75 minutes of vigorous-intensity exercise every week.  A mix of moderate-intensity and vigorous-intensity exercise every week. Children, pregnant women, people who are out of shape, people who are overweight, and older adults may need to consult a health care provider for individual recommendations. If you have any sort of medical condition, be sure to consult your health care provider before starting a new exercise program. WHAT ARE SOME ACTIVITIES THAT CAN HELP ME TO LOSE WEIGHT?   Walking at a rate of at least 4.5 miles an hour.  Jogging or running at a rate of 5 miles per hour.  Biking at a rate of at least 10 miles per hour.  Lap swimming.  Roller-skating or in-line skating.  Cross-country skiing.  Vigorous competitive sports, such as football, basketball, and soccer.  Jumping rope.  Aerobic dancing. HOW CAN I BE MORE ACTIVE IN MY DAY-TO-DAY ACTIVITIES?  Use the stairs instead of the elevator.  Take a walk during your lunch break.  If you drive, park your car farther away from work or school.  If you take public transportation, get off one stop early and walk the rest of the way.  Make all of your phone calls while standing up and walking around.  Get up, stretch, and walk around every 30 minutes throughout the day. WHAT GUIDELINES SHOULD I FOLLOW WHILE EXERCISING?  Do not exercise so much that you hurt yourself, feel dizzy, or get very short of breath.  Consult your health care provider prior to  starting a new exercise program.  Wear comfortable clothes and shoes with good support.  Drink plenty of water while you exercise to prevent dehydration or heat stroke. Body water is lost during exercise and must be replaced.  Work out until you breathe faster and your heart beats faster.   This information is not intended to replace advice given to you by your health care provider.  Make sure you discuss any questions you have with your health care provider.   Document Released: 08/13/2010 Document Revised: 08/01/2014 Document Reviewed: 12/12/2013 Elsevier Interactive Patient Education 2016 Elsevier Inc.  Bupropion; Naltrexone extended-release tablets What is this medicine? BUPROPION; NALTREXONE (byoo PROE pee on; nal TREX one) is a combination product used to promote and maintain weight loss in obese adults or overweight adults who also have weight related medical problems. This medicine should be used with a reduced calorie diet and increased physical activity. This medicine may be used for other purposes; ask your health care provider or pharmacist if you have questions. What should I tell my health care provider before I take this medicine? They need to know if you have any of these conditions: -an eating disorder, such as anorexia or bulimia -diabetes -glaucoma -head injury -heart disease -high blood pressure -history of a drug or alcohol abuse problem -history of a tumor or infection of your brain or spine -history of stroke -history of irregular heartbeat -kidney disease -liver disease -mental illness such as bipolar disorder or psychosis -seizures -suicidal thoughts, plans, or attempt; a previous suicide attempt by you or a family member -an unusual or allergic reaction to bupropion, naltrexone, other medicines, foods, dyes, or preservatives breast-feeding -pregnant or trying to become pregnant How should I use this medicine? Take this medicine by mouth with a glass of water. Follow the directions on the prescription label. Take this medicine in the morning and in the evenings as directed by your healthcare professional. Bonita Quin can take it with or without food. Do not take with high-fat meals as this may increase your risk of seizures. Do not crush, chew, or cut these tablets. Do not take your medicine more often than directed. Do not stop taking this  medicine suddenly except upon the advice of your doctor. A special MedGuide will be given to you by the pharmacist with each prescription and refill. Be sure to read this information carefully each time. Talk to your pediatrician regarding the use of this medicine in children. Special care may be needed. Overdosage: If you think you have taken too much of this medicine contact a poison control center or emergency room at once. NOTE: This medicine is only for you. Do not share this medicine with others. What if I miss a dose? If you miss a dose, skip the missed dose and take your next tablet at the regular time. Do not take double or extra doses. What may interact with this medicine? Do not take this medicine with any of the following medications: -any prescription or street opioid drug like codiene, heroin, methadone -linezolid -MAOIs like Carbex, Eldepryl, Marplan, Nardil, and Parnate -methylene blue (injected into a vein) -other medicines that contain bupropion like Zyban or Wellbutrin This medicine may also interact with the following medications: -alcohol -certain medicines for anxiety or sleep -certain medicines for blood pressure like metoprolol, propranolol -certain medicines for depression or psychotic disturbances -certain medicines for HIV or AIDS like efavirenz, lopinavir, nelfinavir, ritonavir -certain medicines for irregular heart beat like propafenone, flecainide -certain medicines for Parkinson's disease  like amantadine, levodopa -certain medicines for seizures like carbamazepine, phenytoin, phenobarbital -cimetidine -clopidogrel -cyclophosphamide -disulfiram -furazolidone -isoniazid -nicotine -orphenadrine -procarbazine -steroid medicines like prednisone or cortisone -stimulant medicines for attention disorders, weight loss, or to stay awake -tamoxifen -theophylline -thioridazine -thiotepa -ticlopidine -tramadol -warfarin This list may not describe all  possible interactions. Give your health care provider a list of all the medicines, herbs, non-prescription drugs, or dietary supplements you use. Also tell them if you smoke, drink alcohol, or use illegal drugs. Some items may interact with your medicine. What should I watch for while using this medicine? This medicine is intended to be used in addition to a healthy diet and appropriate exercise. The best results are achieved this way. Do not increase or in any way change your dose without consulting your doctor or health care professional. Do not take this medicine with other prescription or over-the-counter weight loss products without consulting your doctor or health care professional. Your doctor should tell you to stop taking this medicine if you do not lose a certain amount of weight within the first 12 weeks of treatment. Visit your doctor or health care professional for regular checkups. Your doctor may order blood tests or other tests to see how you are doing. This medicine may affect blood sugar levels. If you have diabetes, check with your doctor or health care professional before you change your diet or the dose of your diabetic medicine. Patients and their families should watch out for new or worsening depression or thoughts of suicide. Also watch out for sudden changes in feelings such as feeling anxious, agitated, panicky, irritable, hostile, aggressive, impulsive, severely restless, overly excited and hyperactive, or not being able to sleep. If this happens, especially at the beginning of treatment or after a change in dose, call your health care professional. Avoid alcoholic drinks while taking this medicine. Drinking large amounts of alcoholic beverages, using sleeping or anxiety medicines, or quickly stopping the use of these agents while taking this medicine may increase your risk for a seizure. What side effects may I notice from receiving this medicine? Side effects that you should  report to your doctor or health care professional as soon as possible: -allergic reactions like skin rash, itching or hives, swelling of the face, lips, or tongue -breathing problems -changes in vision, hearing -chest pain -confusion -dark urine -depressed mood -fast or irregular heart beat -fever -hallucination, loss of contact with reality -increased blood pressure -light-colored stools -redness, blistering, peeling or loosening of the skin, including inside the mouth -right upper belly pain -seizures -suicidal thoughts or other mood changes -unusually weak or tired -vomiting -yellowing of the eyes or skin Side effects that usually do not require medical attention (Report these to your doctor or health care professional if they continue or are bothersome.): -constipation -diarrhea -dizziness -dry mouth -headache -nausea -trouble sleeping This list may not describe all possible side effects. Call your doctor for medical advice about side effects. You may report side effects to FDA at 1-800-FDA-1088. Where should I keep my medicine? Keep out of the reach of children. Store at room temperature between 15 and 30 degrees C (59 and 86 degrees F). Throw away any unused medicine after the expiration date. NOTE: This sheet is a summary. It may not cover all possible information. If you have questions about this medicine, talk to your doctor, pharmacist, or health care provider.    2016, Elsevier/Gold Standard. (2013-04-17 15:17:29)

## 2015-12-09 NOTE — Telephone Encounter (Signed)
LVM letting pt know results. 

## 2016-02-11 ENCOUNTER — Ambulatory Visit (HOSPITAL_COMMUNITY): Payer: BLUE CROSS/BLUE SHIELD | Admitting: Psychiatry

## 2016-03-29 ENCOUNTER — Ambulatory Visit (HOSPITAL_COMMUNITY): Payer: BLUE CROSS/BLUE SHIELD | Admitting: Psychiatry

## 2016-03-29 ENCOUNTER — Telehealth (HOSPITAL_COMMUNITY): Payer: Self-pay

## 2016-03-29 NOTE — Telephone Encounter (Signed)
03/25/16 11:07am Called and spoke with pt in reference moving appt - left msg..Gregory Roach/sh  03/29/16 8:49am Patient came into the office for his visit was very upset because he had to be r/s again due to provider is still out on medical leave - pt walk out of the office very upset - pt's wife came to the front desk and ask what happen explained the reason why - the pt was given the option to go the [place] - gave pt's wife [facility] and Dr. Jannifer FranklinAkintayo information for an appt.Marland Kitchen.Gregory Roach/sh

## 2016-08-05 ENCOUNTER — Encounter: Payer: Self-pay | Admitting: Family

## 2016-08-05 ENCOUNTER — Ambulatory Visit (INDEPENDENT_AMBULATORY_CARE_PROVIDER_SITE_OTHER): Payer: Managed Care, Other (non HMO) | Admitting: Family

## 2016-08-05 DIAGNOSIS — Z0000 Encounter for general adult medical examination without abnormal findings: Secondary | ICD-10-CM | POA: Diagnosis not present

## 2016-08-05 MED ORDER — PHENTERMINE HCL 37.5 MG PO CAPS: 37.5000 mg | ORAL_CAPSULE | ORAL | 0 refills | Status: DC

## 2016-08-05 NOTE — Assessment & Plan Note (Signed)
1) Anticipatory Guidance: Discussed importance of wearing a seatbelt while driving and not texting while driving; changing batteries in smoke detector at least once annually; wearing suntan lotion when outside; eating a balanced and moderate diet; getting physical activity at least 30 minutes per day.  2) Immunizations / Screenings / Labs:  All immunizations are up to date per recommendations. Due for a dental exam encouraged to be completed independently. All other screenings are up to date per recommendations. Obtain CBC, CMET, and lipid profile.    Overall well exam with risk factors for cardiovascular disease including morbid obesity. Recommend weight loss of 5-10% of current body weight through nutrition and physical activity. Discussed various methods of weight loss including Weight Watchers, Nutrisystem, and medication. Patient wishes to start phentermine. He is a new father. Continues to work on improving exercise. Continue other healthy lifestyle behaviors and choices. Follow up prevention exam in 1 year. Follow up office visit for weight check in 1 month.

## 2016-08-05 NOTE — Patient Instructions (Signed)
Thank you for choosing [facility].  SUMMARY AND INSTRUCTIONS:  Medication:  Please start the phentermine.   Your prescription(s) have been submitted to your pharmacy or been printed and provided for you. Please take as directed and contact our office if you believe you are having problem(s) with the medication(s) or have any questions.  Labs:  Please stop by the lab on the lower level of the building for your blood work. Your results will be released to MyChart (or called to you) after review, usually within 72 hours after test completion. If any changes need to be made, you will be notified at that same time.  1.) The lab is open from 7:30am to 5:30 pm Monday-Friday 2.) No appointment is necessary 3.) Fasting (if needed) is 6-8 hours after food and drink; black coffee and water are okay   Follow up:  If your symptoms worsen or fail to improve, please contact our office for further instruction, or in case of emergency go directly to the emergency room at the closest medical facility.   Health Maintenance, Male A healthy lifestyle and preventative care can promote health and wellness.  Maintain regular health, dental, and eye exams.  Eat a healthy diet. Foods like vegetables, fruits, whole grains, low-fat dairy products, and lean protein foods contain the nutrients you need and are low in calories. Decrease your intake of foods high in solid fats, added sugars, and salt. Get information about a proper diet from your health care provider, if necessary.  Regular physical exercise is one of the most important things you can do for your health. Most adults should get at least 150 minutes of moderate-intensity exercise (any activity that increases your heart rate and causes you to sweat) each week. In addition, most adults need muscle-strengthening exercises on 2 or more days a week.   Maintain a healthy weight. The body mass index (BMI) is a screening tool to identify possible  weight problems. It provides an estimate of body fat based on height and weight. Your health care provider can find your BMI and can help you achieve or maintain a healthy weight. For males 20 years and older:  A BMI below 18.5 is considered underweight.  A BMI of 18.5 to 24.9 is normal.  A BMI of 25 to 29.9 is considered overweight.  A BMI of 30 and above is considered obese.  Maintain normal blood lipids and cholesterol by exercising and minimizing your intake of saturated fat. Eat a balanced diet with plenty of fruits and vegetables. Blood tests for lipids and cholesterol should begin at age 30 and be repeated every 5 years. If your lipid or cholesterol levels are high, you are over age 30, or you are at high risk for heart disease, you may need your cholesterol levels checked more frequently.Ongoing high lipid and cholesterol levels should be treated with medicines if diet and exercise are not working.  If you smoke, find out from your health care provider how to quit. If you do not use tobacco, do not start.  Lung cancer screening is recommended for adults aged 55-80 years who are at high risk for developing lung cancer because of a history of smoking. A yearly low-dose CT scan of the lungs is recommended for people who have at least a 30-pack-year history of smoking and are current smokers or have quit within the past 15 years. A pack year of smoking is smoking an average of 1 pack of cigarettes a day for 1 year (  for example, a 30-pack-year history of smoking could mean smoking 1 pack a day for 30 years or 2 packs a day for 15 years). Yearly screening should continue until the smoker has stopped smoking for at least 15 years. Yearly screening should be stopped for people who develop a health problem that would prevent them from having lung cancer treatment.  If you choose to drink alcohol, do not have more than 2 drinks per day. One drink is considered to be 12 oz (360 mL) of beer, 5 oz (150  mL) of wine, or 1.5 oz (45 mL) of liquor.  Avoid the use of street drugs. Do not share needles with anyone. Ask for help if you need support or instructions about stopping the use of drugs.  High blood pressure causes heart disease and increases the risk of stroke. High blood pressure is more likely to develop in:  People who have blood pressure in the end of the normal range (100-139/85-89 mm Hg).  People who are overweight or obese.  People who are African American.  If you are 77-67 years of age, have your blood pressure checked every 3-5 years. If you are 33 years of age or older, have your blood pressure checked every year. You should have your blood pressure measured twice-once when you are at a hospital or clinic, and once when you are not at a hospital or clinic. Record the average of the two measurements. To check your blood pressure when you are not at a hospital or clinic, you can use:  An automated blood pressure machine at a pharmacy.  A home blood pressure monitor.  If you are 24-75 years old, ask your health care provider if you should take aspirin to prevent heart disease.  Diabetes screening involves taking a blood sample to check your fasting blood sugar level. This should be done once every 3 years after age 89 if you are at a normal weight and without risk factors for diabetes. Testing should be considered at a younger age or be carried out more frequently if you are overweight and have at least 1 risk factor for diabetes.  Colorectal cancer can be detected and often prevented. Most routine colorectal cancer screening begins at the age of 71 and continues through age 28. However, your health care provider may recommend screening at an earlier age if you have risk factors for colon cancer. On a yearly basis, your health care provider may provide home test kits to check for hidden blood in the stool. A small camera at the end of a tube may be used to directly examine the colon  (sigmoidoscopy or colonoscopy) to detect the earliest forms of colorectal cancer. Talk to your health care provider about this at age 27 when routine screening begins. A direct exam of the colon should be repeated every 5-10 years through age 2, unless early forms of precancerous polyps or small growths are found.  People who are at an increased risk for hepatitis B should be screened for this virus. You are considered at high risk for hepatitis B if:  You were born in a country where hepatitis B occurs often. Talk with your health care provider about which countries are considered high risk.  Your parents were born in a high-risk country and you have not received a shot to protect against hepatitis B (hepatitis B vaccine).  You have HIV or AIDS.  You use needles to inject street drugs.  You live with, or have sex  with, someone who has hepatitis B.  You are a man who has sex with other men (MSM).  You get hemodialysis treatment.  You take certain medicines for conditions like cancer, organ transplantation, and autoimmune conditions.  Hepatitis C blood testing is recommended for all people born from [birth year] through 1964 and any individual with known risk factors for hepatitis C.  Healthy men should no longer receive prostate-specific antigen (PSA) blood tests as part of routine cancer screening. Talk to your health care provider about prostate cancer screening.  Testicular cancer screening is not recommended for adolescents or adult males who have no symptoms. Screening includes self-exam, a health care provider exam, and other screening tests. Consult with your health care provider about any symptoms you have or any concerns you have about testicular cancer.  Practice safe sex. Use condoms and avoid high-risk sexual practices to reduce the spread of sexually transmitted infections (STIs).  You should be screened for STIs, including gonorrhea and chlamydia if:  You are sexually active and  are younger than 24 years.  You are older than 24 years, and your health care provider tells you that you are at risk for this type of infection.  Your sexual activity has changed since you were last screened, and you are at an increased risk for chlamydia or gonorrhea. Ask your health care provider if you are at risk.  If you are at risk of being infected with HIV, it is recommended that you take a prescription medicine daily to prevent HIV infection. This is called pre-exposure prophylaxis (PrEP). You are considered at risk if:  You are a man who has sex with other men (MSM).  You are a heterosexual man who is sexually active with multiple partners.  You take drugs by injection.  You are sexually active with a partner who has HIV.  Talk with your health care provider about whether you are at high risk of being infected with HIV. If you choose to begin PrEP, you should first be tested for HIV. You should then be tested every 3 months for as long as you are taking PrEP.  Use sunscreen. Apply sunscreen liberally and repeatedly throughout the day. You should seek shade when your shadow is shorter than you. Protect yourself by wearing long sleeves, pants, a wide-brimmed hat, and sunglasses year round whenever you are outdoors.  Tell your health care provider of new moles or changes in moles, especially if there is a change in shape or color. Also, tell your health care provider if a mole is larger than the size of a pencil eraser.  A one-time screening for abdominal aortic aneurysm (AAA) and surgical repair of large AAAs by ultrasound is recommended for men aged 65-75 years who are current or former smokers.  Stay current with your vaccines (immunizations). This information is not intended to replace advice given to you by your health care provider. Make sure you discuss any questions you have with your health care provider. Document Released: 01/07/2008 Document Revised: 08/01/2014 Document  Reviewed: 04/14/2015 Elsevier Interactive Patient Education  2017 [employer].

## 2016-08-05 NOTE — Progress Notes (Signed)
Pre visit review using our clinic review tool, if applicable. No additional management support is needed unless otherwise documented below in the visit note. 

## 2016-08-05 NOTE — Progress Notes (Signed)
Subjective:    Patient ID: Gregory Roach, male    DOB: 06-22-1986, 31 y.o.   MRN: [id number]  Chief Complaint  Patient presents with  . Annual Exam  . Weight Loss    wants to lose weight needs something for this    HPI:  Gregory Roach is a 31 y.o. male who presents today for an annual wellness visit.   1) Health Maintenance -   Diet - Averages about 2 meals per day consisting of a low carb diet over the past 1-2 months; Caffeine intake a couple of times per week.   Exercise - Fair amount of walking at work back and forth across [employer]. When warmer walking about 1 mile at a time.    2) Preventative Exams / Immunizations:  Dental -- Due for exam  Vision -- Up to date   Health Maintenance  Topic Date Due  . HIV Screening  12/17/2001  . TETANUS/TDAP  12/06/2018  . INFLUENZA VACCINE  Completed    There is no immunization history for the selected administration types on file for this patient.   No Known Allergies   Outpatient Medications Prior to Visit  Medication Sig Dispense Refill  . Naltrexone-Bupropion HCl ER (CONTRAVE) 8-90 MG TB12 One tablet by mouth once daily in the morning for 1 week; at week 2, increase to 1 tablet twice daily in the morning and evening and continue for 1 week; at week 3, increase to 2 tablets in the morning and 1 tablet in the evening and continue for 1 week; at week 4, increase to 2 tablets twice daily in the morning and evening. (Patient not taking: Reported on 08/05/2016) 120 tablet 0   No facility-administered medications prior to visit.      Past Medical History:  Diagnosis Date  . Depression   . Morbid obesity (HCC)      History reviewed. No pertinent surgical history.   Family History  Problem Relation Age of Onset  . Arthritis Mother   . Asthma Mother   . Chiari malformation Mother   . Hyperlipidemia Father   . Heart disease Father   . Heart failure Father   . Hypertension Father   . Diabetes Maternal  Grandmother   . Diabetes Maternal Grandfather   . Diabetes Paternal Grandmother   . Alzheimer's disease Paternal Grandmother      Social History   Social History  . Marital status: Single    Spouse name: N/A  . Number of children: 1  . 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 Smoking status: Never Smoker  . Smokeless tobacco: Never Used     Comment: vapor  . Alcohol use Yes     Comment: occasionally - couple times per month  . Drug use: No  . Sexual activity: Not on file   Other Topics Concern  . Not on file   Social History Narrative   Fun: Go fishing, hiking, weight lifting.         Review of Systems  Constitutional: Denies fever, chills, fatigue, or significant weight gain/loss. HENT: Head: Denies headache or neck pain Ears: Denies changes in hearing, ringing in ears, earache, drainage Nose: Denies discharge, stuffiness, itching, nosebleed, sinus pain Throat: Denies sore throat, hoarseness, dry mouth, sores, thrush Eyes: Denies loss/changes in vision, pain, redness, blurry/double vision, flashing lights Cardiovascular: Denies chest pain/discomfort, tightness, palpitations, shortness of breath with activity, difficulty  lying down, swelling, sudden awakening with shortness of breath Respiratory: Denies shortness of breath, cough, sputum production, wheezing Gastrointestinal: Denies dysphasia, heartburn, change in appetite, nausea, change in bowel habits, rectal bleeding, constipation, diarrhea, yellow skin or eyes Genitourinary: Denies frequency, urgency, burning/pain, blood in urine, incontinence, change in urinary strength. Musculoskeletal: Denies muscle/joint pain, stiffness, back pain, redness or swelling of joints, trauma Skin: Denies rashes, lumps, itching, dryness, color changes, or hair/nail changes Neurological: Denies dizziness, fainting, seizures, weakness, numbness, tingling, tremor Psychiatric -  Denies nervousness, stress, depression or memory loss Endocrine: Denies heat or cold intolerance, sweating, frequent urination, excessive thirst, changes in appetite Hematologic: Denies ease of bruising or bleeding     Objective:     BP 138/78   Pulse (!) 111   Temp 98.5 F (36.9 C) (Oral)   Resp 20   Wt (!) 372 lb (168.7 kg)   SpO2 97%   BMI 50.45 kg/m  Nursing note and vital signs reviewed.  Physical Exam  Constitutional: He is oriented to person, place, and time. He appears well-developed and well-nourished.  HENT:  Head: Normocephalic.  Right Ear: Hearing, tympanic membrane, external ear and ear canal normal.  Left Ear: Hearing, tympanic membrane, external ear and ear canal normal.  Nose: Nose normal.  Mouth/Throat: Uvula is midline, oropharynx is clear and moist and mucous membranes are normal.  Eyes: Conjunctivae and EOM are normal. Pupils are equal, round, and reactive to light.  Neck: Neck supple. No JVD present. No tracheal deviation present. No thyromegaly present.  Cardiovascular: Normal rate, regular rhythm, normal heart sounds and intact distal pulses.   Pulmonary/Chest: Effort normal and breath sounds normal.  Abdominal: Soft. Bowel sounds are normal. He exhibits no distension and no mass. There is no tenderness. There is no rebound and no guarding.  Musculoskeletal: Normal range of motion. He exhibits no edema or tenderness.  Lymphadenopathy:    He has no cervical adenopathy.  Neurological: He is alert and oriented to person, place, and time. He has normal reflexes. No cranial nerve deficit. He exhibits normal muscle tone. Coordination normal.  Skin: Skin is warm and dry.  Psychiatric: He has a normal mood and affect. His behavior is normal. Judgment and thought content normal.       Assessment & Plan:   Problem List Items Addressed This Visit      Other   Routine general medical examination at a health care facility    1) Anticipatory  Guidance: Discussed importance of wearing a seatbelt while driving and not texting while driving; changing batteries in smoke detector at least once annually; wearing suntan lotion when outside; eating a balanced and moderate diet; getting physical activity at least 30 minutes per day.  2) Immunizations / Screenings / Labs:  All immunizations are up to date per recommendations. Due for a dental exam encouraged to be completed independently. All other screenings are up to date per recommendations. Obtain CBC, CMET, and lipid profile.    Overall well exam with risk factors for cardiovascular disease including morbid obesity. Recommend weight loss of 5-10% of current body weight through nutrition and physical activity. Discussed various methods of weight loss including Weight Watchers, Nutrisystem, and medication. Patient wishes to start phentermine. He is a new father. Continues to work on improving exercise. Continue other healthy lifestyle behaviors and choices. Follow up prevention exam in 1 year. Follow up office visit for weight check in 1 month.       Relevant Orders   CBC  Comprehensive metabolic panel   Lipid panel   TSH       I have discontinued Mr. Fiorito's Naltrexone-Bupropion HCl ER. I am also having him start on phentermine.   Meds ordered this encounter  Medications  . phentermine 37.5 MG capsule    Sig: Take 1 capsule (37.5 mg total) by mouth every morning.    Dispense:  30 capsule    Refill:  0    Order Specific Question:   Supervising Provider    Answer:   Hillard DankerRAWFORD, ELIZABETH A [[id number]]     Follow-up: Return in about 1 month (around 09/05/2016), or if symptoms worsen or fail to improve.   Jeanine Luzalone, Sui Kasparek, FNP

## 2016-09-05 ENCOUNTER — Ambulatory Visit: Payer: Managed Care, Other (non HMO) | Admitting: Family

## 2016-09-28 ENCOUNTER — Ambulatory Visit (INDEPENDENT_AMBULATORY_CARE_PROVIDER_SITE_OTHER): Payer: Managed Care, Other (non HMO) | Admitting: Family Medicine

## 2016-09-28 ENCOUNTER — Encounter: Payer: Self-pay | Admitting: Family Medicine

## 2016-09-28 VITALS — BP 147/82 | HR 85 | Temp 98.8°F | Ht 72.0 in | Wt 361.0 lb

## 2016-09-28 DIAGNOSIS — M7702 Medial epicondylitis, left elbow: Secondary | ICD-10-CM | POA: Diagnosis not present

## 2016-09-28 DIAGNOSIS — M25561 Pain in right knee: Secondary | ICD-10-CM

## 2016-09-28 DIAGNOSIS — M79602 Pain in left arm: Secondary | ICD-10-CM

## 2016-09-28 DIAGNOSIS — M25562 Pain in left knee: Secondary | ICD-10-CM

## 2016-09-28 DIAGNOSIS — M79601 Pain in right arm: Secondary | ICD-10-CM | POA: Diagnosis not present

## 2016-09-28 MED ORDER — MELOXICAM 15 MG PO TABS: 15.0000 mg | ORAL_TABLET | Freq: Every day | ORAL | 0 refills | Status: DC

## 2016-09-28 NOTE — Progress Notes (Signed)
Patient ID: Gregory Roach, male    DOB: 06/19/1986, 31 y.o.   MRN: [id number]  PCP: Jeanine Luz, FNP  Chief Complaint  Patient presents with  . Arm Pain    X 1 day due to exercise- left side ( both side)  . Knee Pain    X 1 day due to exercise- left side ( both side)    Subjective:  HPI 31 year old male presents for evaluation of left elbow pain. He has complains of bilateral arm and knee pain as also. He recently started a new circuit training program that consists of vigorous repetitive exercise. Elbow pain started Monday. He has applied ice applications and taken ibuprofen without relief of pain.  Reports some swelling initially and swelling went away, pain has continued. Uncertain of which exercise routine caused injury. He also reports bilateral arm and knee pain x 1 day. Denies any sensation of weakness. Characterizes pain as aching.  Social History   Social History  . Marital status: Single    Spouse name: N/A  . Number of children: 1  . 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 Smoking status: Never Smoker  . Smokeless tobacco: Never Used     Comment: vapor  . Alcohol use Yes     Comment: occasionally - couple times per month  . Drug use: No  . Sexual activity: Not on file   Other Topics Concern  . Not on file   Social History Narrative   Fun: Go fishing, hiking, weight lifting.       Family History  Problem Relation Age of Onset  . Arthritis Mother   . Asthma Mother   . Chiari malformation Mother   . Hyperlipidemia Father   . Heart disease Father   . Heart failure Father   . Hypertension Father   . Diabetes Maternal Grandmother   . Diabetes Maternal Grandfather   . Diabetes Paternal Grandmother   . Alzheimer's disease Paternal Grandmother    Review of Systems See HPI  Patient Active Problem List   Diagnosis Date Noted  . Routine general medical examination at a health care facility  08/05/2016  . Morbid obesity (HCC) 12/07/2015  . ADD (attention deficit disorder) 12/07/2015  . Depression 12/07/2015   No Known Allergies  Prior to Admission medications   Medication Sig Start Date End Date Taking? Authorizing Provider  phentermine 37.5 MG capsule Take 1 capsule (37.5 mg total) by mouth every morning. 08/05/16  Yes Veryl Speak, FNP    Past Medical, Surgical Family and Social History reviewed and updated.    Objective:   Today's Vitals   09/28/16 1432  BP: (!) 147/82  Pulse: 85  Temp: 98.8 F (37.1 C)  TempSrc: Oral  SpO2: 97%  Weight: (!) 361 lb (163.7 kg)  Height: 6' (1.829 m)    Wt Readings from Last 3 Encounters:  09/28/16 (!) 361 lb (163.7 kg)  08/05/16 (!) 372 lb (168.7 kg)  12/07/15 (!) 356 lb (161.5 kg)   Physical Exam  Constitutional: He appears well-developed and well-nourished.  Musculoskeletal:       Left elbow: He exhibits decreased range of motion and swelling. Tenderness found. Medial epicondyle and olecranon process tenderness noted.       Right knee: He exhibits normal range of motion, no swelling, no effusion and no bony tenderness. Tenderness found. LCL tenderness noted. No patellar tendon tenderness noted.  Left knee: He exhibits normal range of motion, no swelling and no bony tenderness. Tenderness found. LCL tenderness noted. No patellar tendon tenderness noted.  Skin: Skin is warm.  Psychiatric: He has a normal mood and affect. His behavior is normal. Judgment and thought content normal.     Assessment & Plan:  1. Epicondylitis elbow, medial, left 2. Acute pain of both knees 3. Pain in both upper extremities  Plan: Start Meloxicam 15 mg once daily no more than 10 days.  Continue to alternate ice and heat on elbow and knees.  May take Tylenol 650 mg for acute pain.  Godfrey PickKimberly S. Tiburcio PeaHarris, MSN, FNP-C [facility] [phone]

## 2016-09-28 NOTE — Patient Instructions (Addendum)
Start Meloxicam 15 mg once daily no more than 10 days.  Continue to alternate ice and heat on elbow and knees.  May take Tylenol 650 mg for acute pain.  IF you received an x-ray today, you will receive an invoice from [facility]. Please contact [facility] at [phone] with questions or concerns regarding your invoice.   IF you received labwork today, you will receive an invoice from [facility]. Please contact [facility] at [phone] with questions or concerns regarding your invoice.   Our billing staff will not be able to assist you with questions regarding bills from these companies.  You will be contacted with the lab results as soon as they are available. The fastest way to get your results is to activate your My Chart account. Instructions are located on the last page of this paperwork. If you have not heard from us regarding the results in 2 weeks, please contact this office.     Knee Pain, Adult Many things can cause knee pain. The pain often goes away on its own with time and rest. If the pain does not go away, tests may be done to find out what is causing the pain. Follow these instructions at home: Activity   Rest your knee.  Do not do things that cause pain.  Avoid activities where both feet leave the ground at the same time (high-impact activities). Examples are running, jumping rope, and doing jumping jacks. General instructions   Take medicines only as told by your doctor.  Raise (elevate) your knee when you are resting. Make sure your knee is higher than your heart.  Sleep with a pillow under your knee.  If told, put ice on the knee:  Put ice in a plastic bag.  Place a towel between your skin and the bag.  Leave the ice on for 20 minutes, 2-3 times a day.  Ask your doctor if you should wear an elastic knee support.  Lose weight if you are overweight. Being overweight can make your knee hurt more.  Do not use any tobacco products. These  include cigarettes, chewing tobacco, or electronic cigarettes. If you need help quitting, ask your doctor. Smoking may slow down healing. Contact a doctor if:  The pain does not stop.  The pain changes or gets worse.  You have a fever along with knee pain.  Your knee gives out or locks up.  Your knee swells, and becomes worse. Get help right away if:  Your knee feels warm.  You cannot move your knee.  You have very bad knee pain.  You have chest pain.  You have trouble breathing. Summary  Many things can cause knee pain. The pain often goes away on its own with time and rest.  Avoid activities that put stress on your knee. These include running and jumping rope.  Get help right away if you cannot move your knee, or if your knee feels warm, or if you have trouble breathing. This information is not intended to replace advice given to you by your health care provider. Make sure you discuss any questions you have with your health care provider. Document Released: 10/07/2008 Document Revised: 07/05/2016 Document Reviewed: 07/05/2016 Elsevier Interactive Patient Education  2017 [employer] Inc[employer]  Tennis Elbow Tennis elbow is puffiness (inflammation) of the outer tendons of your forearm close to your elbow. Your tendons attach your muscles to your bones. Tennis elbow can happen in any sport or job in which you use your elbow too much.  It is caused by doing the same motion over and over. Tennis elbow can cause:  Pain and tenderness in your forearm and the outer part of your elbow.  A burning feeling. This runs from your elbow through your arm.  Weak grip in your hands. Follow these instructions at home: Activity   Rest your elbow and wrist as told by your doctor. Try to avoid any activities that caused the problem until your doctor says that you can do them again.  If a physical therapist teaches you exercises, do all of them as told.  If you lift an object, lift it with your  palm facing up. This is easier on your elbow. Lifestyle   If your tennis elbow is caused by sports, check your equipment and make sure that:  You are using it correctly.  It fits you well.  If your tennis elbow is caused by work, take breaks often, if you are able. Talk with your manager about doing your work in a way that is safe for you.  If your tennis elbow is caused by computer use, talk with your manager about any changes that can be made to your work setup. General instructions   If told, apply ice to the painful area:  Put ice in a plastic bag.  Place a towel between your skin and the bag.  Leave the ice on for 20 minutes, 2-3 times per day.  Take medicines only as told by your doctor.  If you were given a brace, wear it as told by your doctor.  Keep all follow-up visits as told by your doctor. This is important. Contact a doctor if:  Your pain does not get better with treatment.  Your pain gets worse.  You have weakness in your forearm, hand, or fingers.  You cannot feel your forearm, hand, or fingers. This information is not intended to replace advice given to you by your health care provider. Make sure you discuss any questions you have with your health care provider. Document Released: 12/29/2009 Document Revised: 03/10/2016 Document Reviewed: 07/07/2014 Elsevier Interactive Patient Education  2017 [employer].

## 2016-10-11 ENCOUNTER — Ambulatory Visit (INDEPENDENT_AMBULATORY_CARE_PROVIDER_SITE_OTHER): Payer: Managed Care, Other (non HMO)

## 2016-10-11 ENCOUNTER — Ambulatory Visit (INDEPENDENT_AMBULATORY_CARE_PROVIDER_SITE_OTHER): Payer: Managed Care, Other (non HMO) | Admitting: Physician Assistant

## 2016-10-11 VITALS — BP 132/88 | HR 94 | Temp 98.9°F | Resp 18 | Ht 72.0 in | Wt 360.2 lb

## 2016-10-11 DIAGNOSIS — R059 Cough, unspecified: Secondary | ICD-10-CM

## 2016-10-11 DIAGNOSIS — R05 Cough: Secondary | ICD-10-CM | POA: Diagnosis not present

## 2016-10-11 MED ORDER — NAPROXEN 500 MG PO TABS: 500.0000 mg | ORAL_TABLET | Freq: Two times a day (BID) | ORAL | 0 refills | Status: DC

## 2016-10-11 MED ORDER — HYDROCODONE-HOMATROPINE 5-1.5 MG/5ML PO SYRP: 2.5000 mL | ORAL_SOLUTION | Freq: Every day | ORAL | 0 refills | Status: AC

## 2016-10-11 NOTE — Patient Instructions (Signed)
     IF you received an x-ray today, you will receive an invoice from [facility]. Please contact [facility] at [phone] with questions or concerns regarding your invoice.   IF you received labwork today, you will receive an invoice from [facility]. Please contact [facility] at [phone] with questions or concerns regarding your invoice.   Our billing staff will not be able to assist you with questions regarding bills from these companies.  You will be contacted with the lab results as soon as they are available. The fastest way to get your results is to activate your My Chart account. Instructions are located on the last page of this paperwork. If you have not heard from us regarding the results in 2 weeks, please contact this office.     

## 2016-10-11 NOTE — Progress Notes (Signed)
10/11/2016 6:48 PM   DOB: 25-Jan-1986 / MRN: [id number]  SUBJECTIVE:  Gregory Roach is a 31 y.o. male presenting for productive cough that started two days ago. He associates fever that was 101 last night.  Denies nasal congestion, sore throat, ear pain. Denies a history of asthma.  He is taking theraflu for his symptoms and this is helping. He never smoker. Denies posterior calf pain and leg swelling.   He has No Known Allergies.   He  has a past medical history of Depression and Morbid obesity (HCC).    He  reports that he has never smoked. He has never used smokeless tobacco. He reports that he drinks alcohol. He reports that he does not use drugs. He  has no sexual activity history on file. The patient  has no past surgical history on file.  His family history includes Alzheimer's disease in his paternal grandmother; Arthritis in his mother; Asthma in his mother; Chiari malformation in his mother; Diabetes in his maternal grandfather, maternal grandmother, and paternal grandmother; Heart disease in his father; Heart failure in his father; Hyperlipidemia in his father; Hypertension in his father.  Review of Systems  Cardiovascular: Negative for chest pain.  Gastrointestinal: Negative for nausea.  Skin: Negative for rash.  Neurological: Negative for dizziness and headaches.    The problem list and medications were reviewed and updated by myself where necessary and exist elsewhere in the encounter.   OBJECTIVE:  BP 132/88 (BP Location: Right Arm, Patient Position: Sitting, Cuff Size: Large)   Pulse 94   Temp 98.9 F (37.2 C) (Oral)   Resp 18   Ht 6' (1.829 m)   Wt (!) 360 lb 3.2 oz (163.4 kg)   SpO2 97%   BMI 48.85 kg/m   Physical Exam  Constitutional: He is oriented to person, place, and time.  Cardiovascular: Normal rate, regular rhythm and normal heart sounds.   Pulmonary/Chest: Effort normal and breath sounds normal. No respiratory distress. He has no wheezes. He has no  rales. He exhibits no tenderness.  Musculoskeletal: Normal range of motion.  Neurological: He is alert and oriented to person, place, and time.     Pulse Readings from Last 3 Encounters:  10/11/16 94  09/28/16 85  08/05/16 (!) 111    No results found for this or any previous visit (from the past 72 hour(s)).  Dg Chest 2 View  Result Date: 10/11/2016 CLINICAL DATA:  Acute onset of cough and fever.  Initial encounter. EXAM: CHEST  2 VIEW COMPARISON:  Chest radiograph from 06/28/2014 FINDINGS: The lungs are well-aerated and clear. There is no evidence of focal opacification, pleural effusion or pneumothorax. The heart is normal in size; the mediastinal contour is within normal limits. No acute osseous abnormalities are seen. IMPRESSION: No acute cardiopulmonary process seen. Electronically Signed   By: Roanna RaiderJeffery  Chang M.D.   On: 10/11/2016 18:33    ASSESSMENT AND PLAN:  Gregory Roach was seen today for cough and fever.  Diagnoses and all orders for this visit:  Cough: Likely viral.  Will start him on cough syrup and naprosyn bid.  Advised it may take up to 14 days for the cough to run its course.  -     DG Chest 2 View; Future -     HYDROcodone-homatropine (HYCODAN) 5-1.5 MG/5ML syrup; Take 2.5-5 mLs by mouth at bedtime. -     naproxen (NAPROSYN) 500 MG tablet; Take 1 tablet (500 mg total) by mouth 2 (two) times daily with a  meal.    The patient is advised to call or return to clinic if he does not see an improvement in symptoms, or to seek the care of the closest emergency department if he worsens with the above plan.   Deliah Boston, MHS, PA-C [facility] 10/11/2016 6:48 PM

## 2017-04-02 ENCOUNTER — Encounter (HOSPITAL_COMMUNITY): Payer: Self-pay | Admitting: *Deleted

## 2017-04-02 ENCOUNTER — Ambulatory Visit (INDEPENDENT_AMBULATORY_CARE_PROVIDER_SITE_OTHER): Payer: Managed Care, Other (non HMO)

## 2017-04-02 ENCOUNTER — Ambulatory Visit (HOSPITAL_COMMUNITY)
Admission: EM | Admit: 2017-04-02 | Discharge: 2017-04-02 | Disposition: A | Discharge status: Home / Self Care | Payer: Managed Care, Other (non HMO) | Attending: Urgent Care | Admitting: Urgent Care

## 2017-04-02 DIAGNOSIS — M5134 Other intervertebral disc degeneration, thoracic region: Secondary | ICD-10-CM

## 2017-04-02 DIAGNOSIS — M545 Low back pain, unspecified: Secondary | ICD-10-CM

## 2017-04-02 DIAGNOSIS — M546 Pain in thoracic spine: Secondary | ICD-10-CM

## 2017-04-02 MED ORDER — CELECOXIB 100 MG PO CAPS: 100.0000 mg | ORAL_CAPSULE | Freq: Two times a day (BID) | ORAL | 1 refills | Status: AC

## 2017-04-02 MED ORDER — PREDNISONE 20 MG PO TABS: ORAL_TABLET | ORAL | 0 refills | Status: DC

## 2017-04-02 MED ORDER — CYCLOBENZAPRINE HCL 10 MG PO TABS: 10.0000 mg | ORAL_TABLET | Freq: Every day | ORAL | 0 refills | Status: AC

## 2017-04-02 MED ORDER — KETOROLAC TROMETHAMINE 60 MG/2ML IM SOLN
60.0000 mg | Freq: Once | INTRAMUSCULAR | Status: AC
  Administered 2017-04-02: 60 mg via INTRAMUSCULAR

## 2017-04-02 MED ORDER — KETOROLAC TROMETHAMINE 60 MG/2ML IM SOLN
INTRAMUSCULAR | Status: AC
  Filled 2017-04-02: qty 2

## 2017-04-02 NOTE — ED Triage Notes (Signed)
C/O mid-to low central back pain since last night.  Denies injury.  Denies parasthesias.  Has tried Naproxen, Tylenol, heat without relief.

## 2017-04-02 NOTE — ED Provider Notes (Signed)
    MRN: [id number] DOB: Dec 23, 1985  Subjective:   Gregory Roach is a 31 y.o. male presenting for chief complaint of Back Pain  Reports 1 day history of worsening mid back pain. Reports that the pain is constant, severe. Has tried naproxen, APAP. Denies fever, radiculopathy, incontinence, dysuria, hematuria, weakness, tingling, numbness, trauma, falls. Has not done any heavy lifting, moving furniture.   Gregory Roach is not currently taking any medications. Also has No Known Allergies.  Gregory Roach  has a past medical history of Depression and Morbid obesity (HCC). Also denies past surgical history.  Objective:   Vitals: BP (!) 142/61   Pulse 72   Temp 98 F (36.7 C)   Resp 18   SpO2 100%   Physical Exam  Constitutional: He is oriented to person, place, and time. He appears well-developed and well-nourished.  Cardiovascular: Normal rate.   Pulmonary/Chest: Effort normal.  Musculoskeletal:       Thoracic back: He exhibits decreased range of motion (flexion), tenderness (with ROM testing over area depicted) and bony tenderness. He exhibits no swelling, no edema, no deformity and no spasm.       Lumbar back: He exhibits decreased range of motion (flexion), tenderness (with ROM testing) and bony tenderness (over area depicted). He exhibits no swelling, no edema, no deformity, no laceration and no spasm.       Back:  Neurological: He is alert and oriented to person, place, and time. He displays normal reflexes. Coordination (having difficulty ambulating, favor back) abnormal.  Skin: Skin is warm and dry.    Dg Thoracic Spine 2 View  Result Date: 04/02/2017 CLINICAL DATA:  Patient states that he has mid to lower back pain since last night, no known injury EXAM: THORACIC SPINE 2 VIEWS COMPARISON:  Two-view chest 09/23/2016 FINDINGS: Normal alignment of the thoracic spine. Degenerative changes throughout with narrowed interspaces and endplate hypertrophic changes, most prominent in the mid and lower  thoracic region. No vertebral compression deformities. No focal bone lesions. No paraspinal soft tissue swelling. IMPRESSION: Degenerative changes in the thoracic spine. Normal alignment. No acute displaced fractures identified. Electronically Signed   By: Burman NievesWilliam  Stevens M.D.   On: 04/02/2017 21:18   Dg Lumbar Spine Complete  Result Date: 04/02/2017 CLINICAL DATA:  Patient states that he has mid to lower back pain since last night, no known injury EXAM: LUMBAR SPINE - COMPLETE 4+ VIEW COMPARISON:  None. FINDINGS: There is no evidence of lumbar spine fracture. Alignment is normal. Intervertebral disc spaces are maintained. IMPRESSION: Negative. Electronically Signed   By: Burman NievesWilliam  Stevens M.D.   On: 04/02/2017 21:17   Assessment and Plan :   Acute midline thoracic back pain  Acute midline low back pain without sciatica  Degenerative disc disease, thoracic   Will start patient on conservative management with Celebrex, Flexeril. Counseled on diagnosis of DDD, back care. Patient provided with script for short steroid course if he has not improved in 3-4 days with Celebrex. Last check for diabetes was 11/2015, was negative. Counseled patient on potential for adverse effects with medications prescribed today, patient verbalized understanding. Return-to-clinic precautions discussed, patient verbalized understanding.   Wallis BambergMario Yafet Cline, PA-C [facility] 04/02/2017  8:37 PM   Wallis BambergMani, Osie Merkin, PA-C 04/02/17 2136

## 2017-04-05 ENCOUNTER — Ambulatory Visit: Payer: Managed Care, Other (non HMO) | Admitting: Internal Medicine

## 2017-04-05 ENCOUNTER — Emergency Department (HOSPITAL_COMMUNITY)
Admission: EM | Admit: 2017-04-05 | Discharge: 2017-04-05 | Disposition: A | Discharge status: Home / Self Care | Payer: Managed Care, Other (non HMO) | Attending: Emergency Medicine | Admitting: Emergency Medicine

## 2017-04-05 ENCOUNTER — Encounter (HOSPITAL_COMMUNITY): Payer: Self-pay | Admitting: Emergency Medicine

## 2017-04-05 DIAGNOSIS — T7840XA Allergy, unspecified, initial encounter: Secondary | ICD-10-CM

## 2017-04-05 MED ORDER — SODIUM CHLORIDE 0.9 % IV BOLUS (SEPSIS)
1000.0000 mL | Freq: Once | INTRAVENOUS | Status: AC
  Administered 2017-04-05: 1000 mL via INTRAVENOUS

## 2017-04-05 MED ORDER — FAMOTIDINE IN NACL 20-0.9 MG/50ML-% IV SOLN
20.0000 mg | Freq: Once | INTRAVENOUS | Status: AC
  Administered 2017-04-05: 20 mg via INTRAVENOUS
  Filled 2017-04-05: qty 50

## 2017-04-05 MED ORDER — METHYLPREDNISOLONE SODIUM SUCC 125 MG IJ SOLR
125.0000 mg | Freq: Once | INTRAMUSCULAR | Status: AC
  Administered 2017-04-05: 125 mg via INTRAVENOUS
  Filled 2017-04-05: qty 2

## 2017-04-05 MED ORDER — PREDNISONE 20 MG PO TABS: ORAL_TABLET | ORAL | 0 refills | Status: AC

## 2017-04-05 MED ORDER — DIPHENHYDRAMINE HCL 50 MG/ML IJ SOLN
50.0000 mg | Freq: Once | INTRAMUSCULAR | Status: AC
  Administered 2017-04-05: 50 mg via INTRAVENOUS

## 2017-04-05 MED ORDER — DIPHENHYDRAMINE HCL 50 MG/ML IJ SOLN
50.0000 mg | Freq: Once | INTRAMUSCULAR | Status: DC
  Filled 2017-04-05: qty 1

## 2017-04-05 NOTE — Discharge Instructions (Signed)
Do not take Celebrex or related medications in the future. This may include ibuprofen and Naprosyn.  Take Benadryl every 6 hours for the next 2 days, then as needed for any itching or rash.

## 2017-04-05 NOTE — ED Provider Notes (Signed)
[facility]-EMERGENCY DEPT Provider Note   CSN: [id number] Arrival date & time: 04/05/17  [id number]     History   Chief Complaint Chief Complaint  Patient presents with  . Allergic Reaction    HPI Gregory Roach is a 31 y.o. male.  Patient presents to the emergency department for evaluation of possible allergic reaction. Patient reports that approximately one hour after taking Celebrex (a new medication) he developed itchy watery eyes, scratchy throat, rash on his arms and legs, torso that is very itchy. He is not having any difficulty breathing.      Past Medical History:  Diagnosis Date  . Depression   . Morbid obesity [facility](HCC)     Patient Active Problem List   Diagnosis Date Noted  . Routine general medical examination at a health care facility 08/05/2016  . Morbid obesity (HCC) 12/07/2015  . ADD (attention deficit disorder) 12/07/2015  . Depression 12/07/2015    History reviewed. No pertinent surgical history.     Home Medications    Prior to Admission medications   Medication Sig Start Date End Date Taking? Authorizing Provider  acetaminophen (TYLENOL) 500 MG tablet Take 1,000 mg by mouth every 6 (six) hours as needed for mild pain.   Yes [provider]  cyclobenzaprine (FLEXERIL) 10 MG tablet Take 1 tablet (10 mg total) by mouth at bedtime. 04/02/17  Yes Wallis BambergMani, Mario, PA-C  celecoxib (CELEBREX) 100 MG capsule Take 1 capsule (100 mg total) by mouth 2 (two) times daily. 04/02/17   Wallis BambergMani, Mario, PA-C  predniSONE (DELTASONE) 20 MG tablet 3 tabs po daily x 3 days, then 2 tabs x 3 days, then 1.5 tabs x 3 days, then 1 tab x 3 days, then 0.5 tabs x 3 days 04/05/17   Gilda CreasePollina, Christopher J, MD    Family History Family History  Problem Relation Age of Onset  . Arthritis Mother   . Asthma Mother   . Chiari malformation Mother   . Hyperlipidemia Father   . Heart disease Father   . Heart failure Father   . Hypertension Father   . Diabetes Maternal Grandmother   . Diabetes  Maternal Grandfather   . Diabetes Paternal Grandmother   . Alzheimer's disease Paternal Grandmother     Social History Social History  Substance Use Topics  . Smoking status: Never Smoker  . Smokeless tobacco: Never Used     Comment: vapor  . Alcohol use No     Allergies   Celecoxib   Review of Systems Review of Systems  Skin: Positive for rash.  All other systems reviewed and are negative.    Physical Exam Updated Vital Signs BP (!) 110/55   Pulse 70   Temp 97.8 F (36.6 C) (Oral)   Resp 18   Ht 6' (1.829 m)   Wt (!) 161 kg (355 lb)   SpO2 97%   BMI 48.15 kg/m   Physical Exam  Constitutional: He is oriented to person, place, and time. He appears well-developed and well-nourished. No distress.  HENT:  Head: Normocephalic and atraumatic.  Right Ear: Hearing normal.  Left Ear: Hearing normal.  Nose: Nose normal.  Mouth/Throat: Oropharynx is clear and moist and mucous membranes are normal.  Eyes: Pupils are equal, round, and reactive to light. Conjunctivae and EOM are normal.  Neck: Normal range of motion. Neck supple.  Cardiovascular: Regular rhythm, S1 normal and S2 normal.  Exam reveals no gallop and no friction rub.   No murmur heard. Pulmonary/Chest: Effort normal and  breath sounds normal. No respiratory distress. He exhibits no tenderness.  Abdominal: Soft. Normal appearance and bowel sounds are normal. There is no hepatosplenomegaly. There is no tenderness. There is no rebound, no guarding, no tenderness at McBurney's point and negative Murphy's sign. No hernia.  Musculoskeletal: Normal range of motion.  Neurological: He is alert and oriented to person, place, and time. He has normal strength. No cranial nerve deficit or sensory deficit. Coordination normal. GCS eye subscore is 4. GCS verbal subscore is 5. GCS motor subscore is 6.  Skin: Skin is warm, dry and intact. No rash noted. No cyanosis.  Diffuse erythematous rash with excoriations  Psychiatric: He  has a normal mood and affect. His speech is normal and behavior is normal. Thought content normal.  Nursing note and vitals reviewed.    ED Treatments / Results  Labs (all labs ordered are listed, but only abnormal results are displayed) Labs Reviewed - No data to display  EKG  EKG Interpretation None       Radiology No results found.  Procedures Procedures (including critical care time)  Medications Ordered in ED Medications  sodium chloride 0.9 % bolus 1,000 mL (0 mLs Intravenous Stopped 04/05/17 0604)  methylPREDNISolone sodium succinate (SOLU-MEDROL) 125 mg/2 mL injection 125 mg (125 mg Intravenous Given 04/05/17 0443)  famotidine (PEPCID) IVPB 20 mg premix (0 mg Intravenous Stopped 04/05/17 0518)  diphenhydrAMINE (BENADRYL) injection 50 mg (50 mg Intravenous Given 04/05/17 0439)     Initial Impression / Assessment and Plan / ED Course  I have reviewed the triage vital signs and the nursing notes.  Pertinent labs & imaging results that were available during my care of the patient were reviewed by me and considered in my medical decision making (see chart for details).     Patient presents to the emergency department for evaluation of acute onset of rash after taking a new medication. Patient has discrete raised erythematous lesions that are confluent areas, but did not exactly resemble urticaria. There is were itchy, no evidence of airway swelling. Patient treated with Benadryl, Pepcid, Solu-Medrol. He has had improvement. Will treat with prednisone taper, continued antihistamines. Avoid Celebrex in future.  Final Clinical Impressions(s) / ED Diagnoses   Final diagnoses:  Allergic reaction, initial encounter    New Prescriptions New Prescriptions   PREDNISONE (DELTASONE) 20 MG TABLET    3 tabs po daily x 3 days, then 2 tabs x 3 days, then 1.5 tabs x 3 days, then 1 tab x 3 days, then 0.5 tabs x 3 days     Gilda Crease, MD 04/05/17 [phone]

## 2017-04-05 NOTE — ED Triage Notes (Signed)
Took  celecoxib pta, now c/o itching redness, scratchy throat and eyes feeling puffy.

## 2017-04-05 NOTE — ED Notes (Signed)
Pt sts he is feeling "much better" MD aware

## 2018-03-30 IMAGING — DX DG LUMBAR SPINE COMPLETE 4+V
4 series · 4 of 4 positions shown · non-contrast
Comparison: None.

CLINICAL DATA: Patient states that he has mid to lower back pain
since last night, no known injury

EXAM:
LUMBAR SPINE - COMPLETE 4+ VIEW

[l-spine ap]
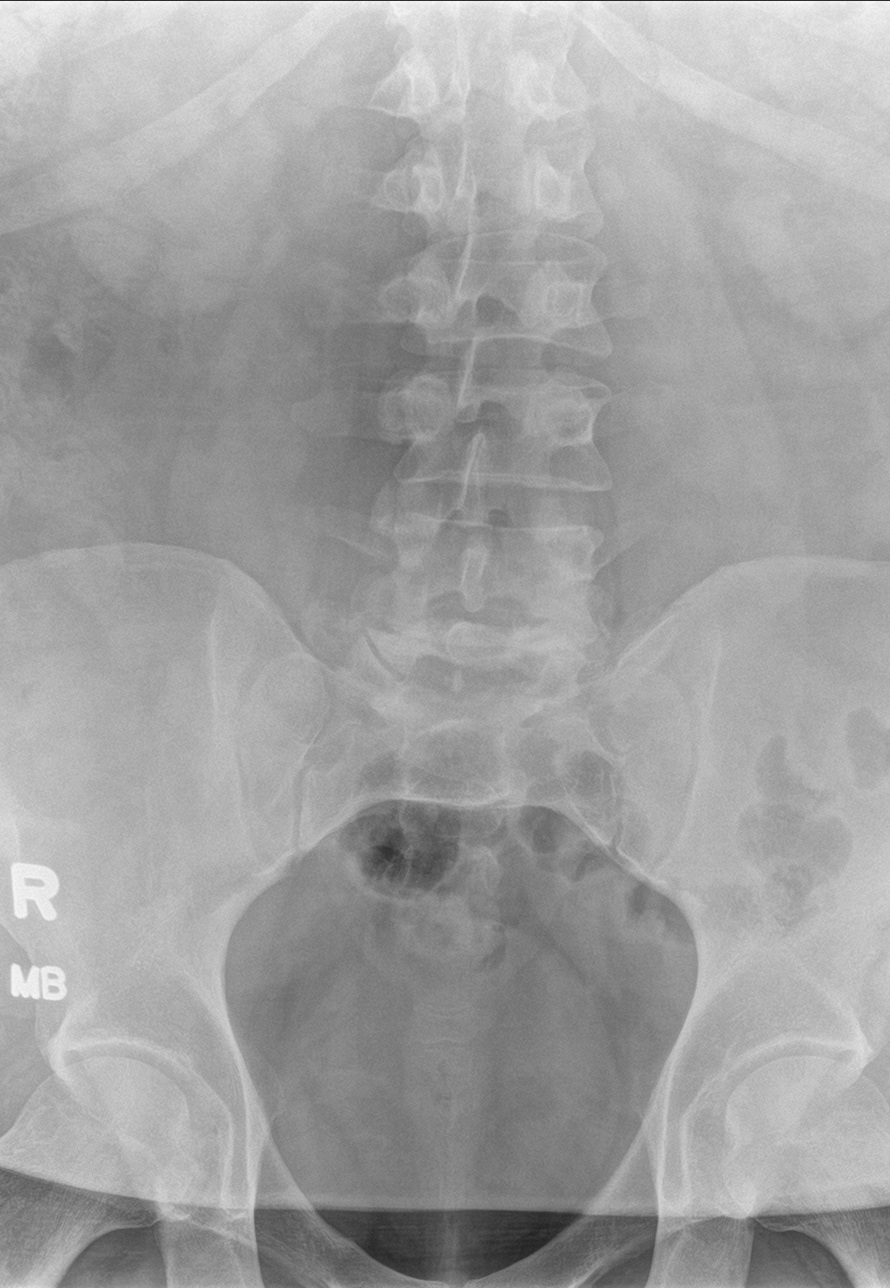

[l-spine obl (1 of 2)]
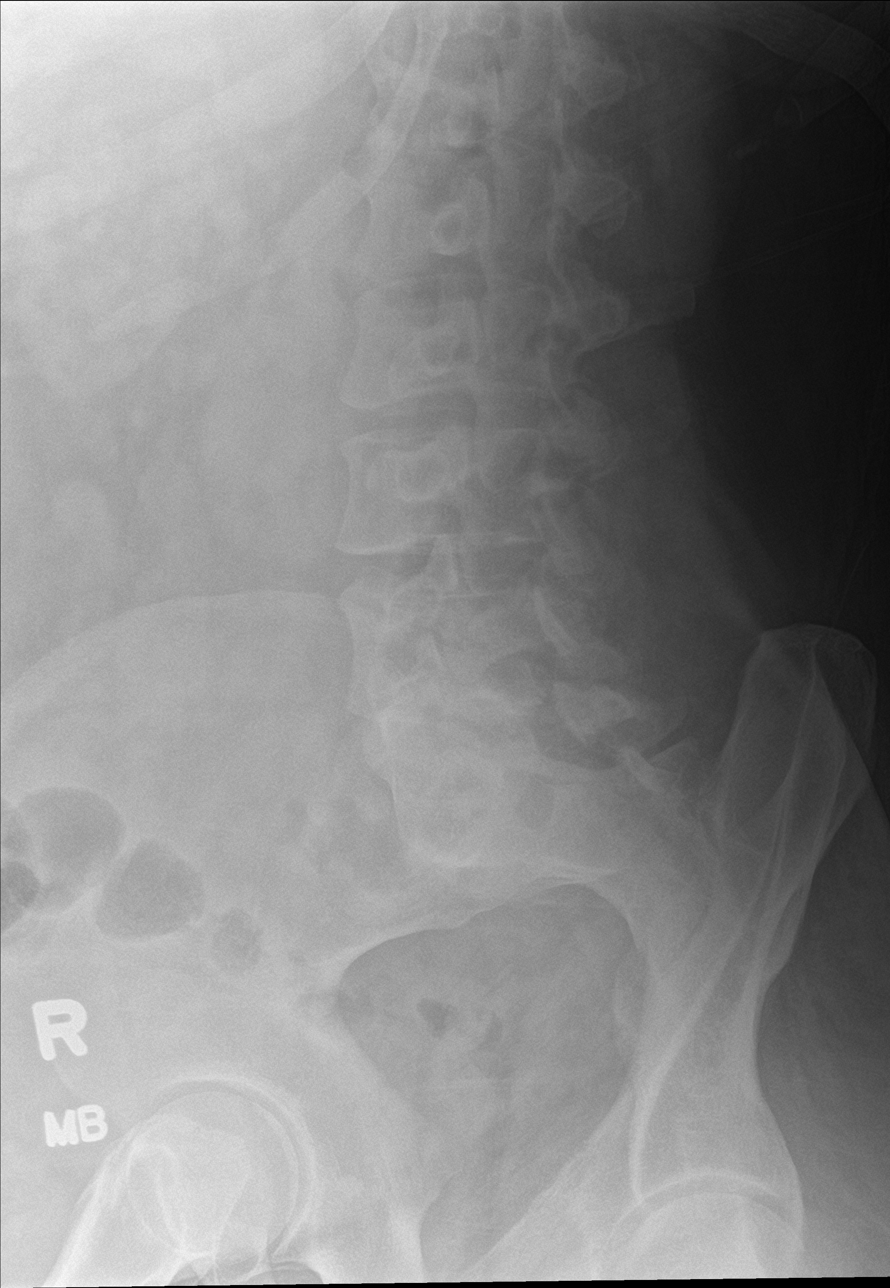

[l-spine obl (2 of 2)]
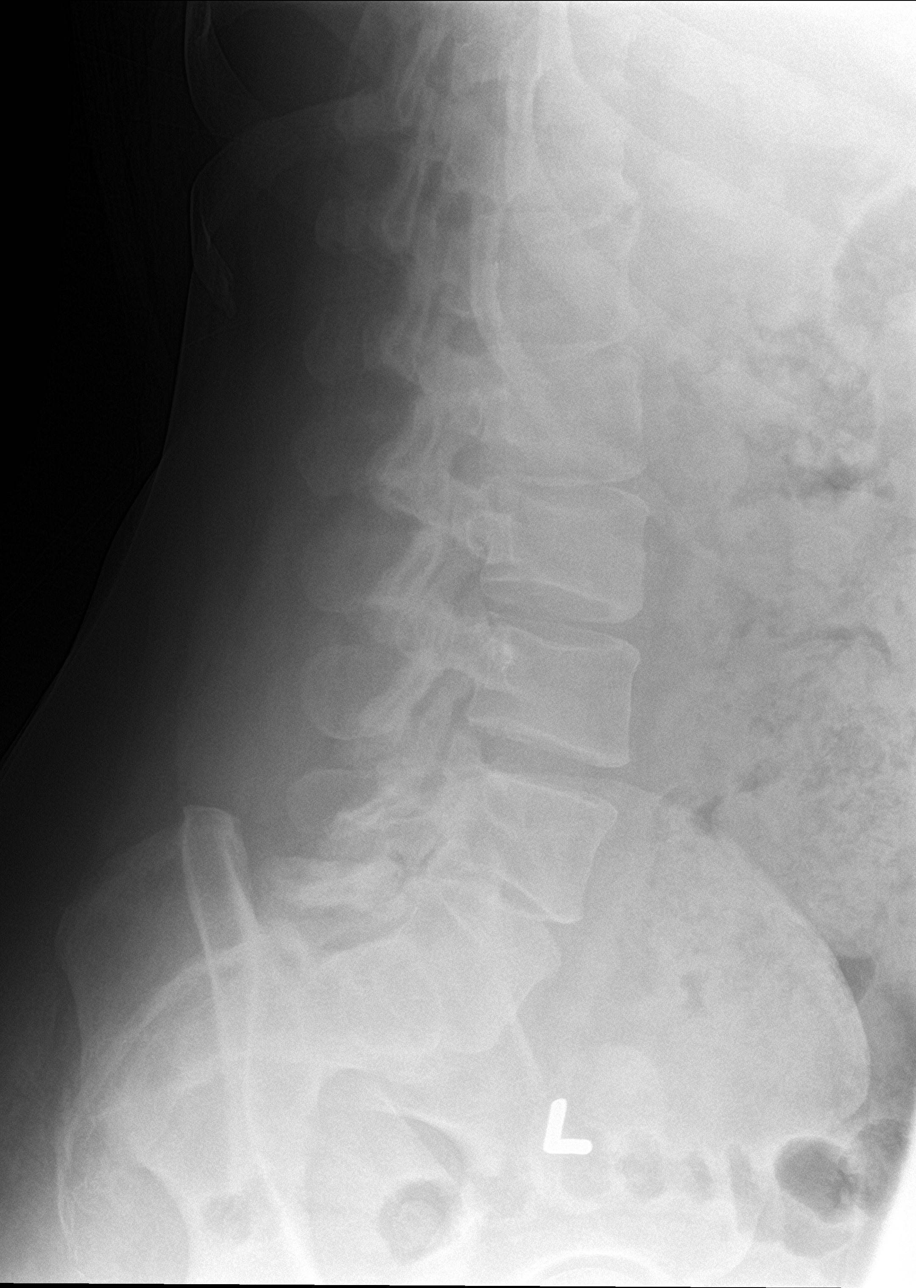

[l-spine lat]
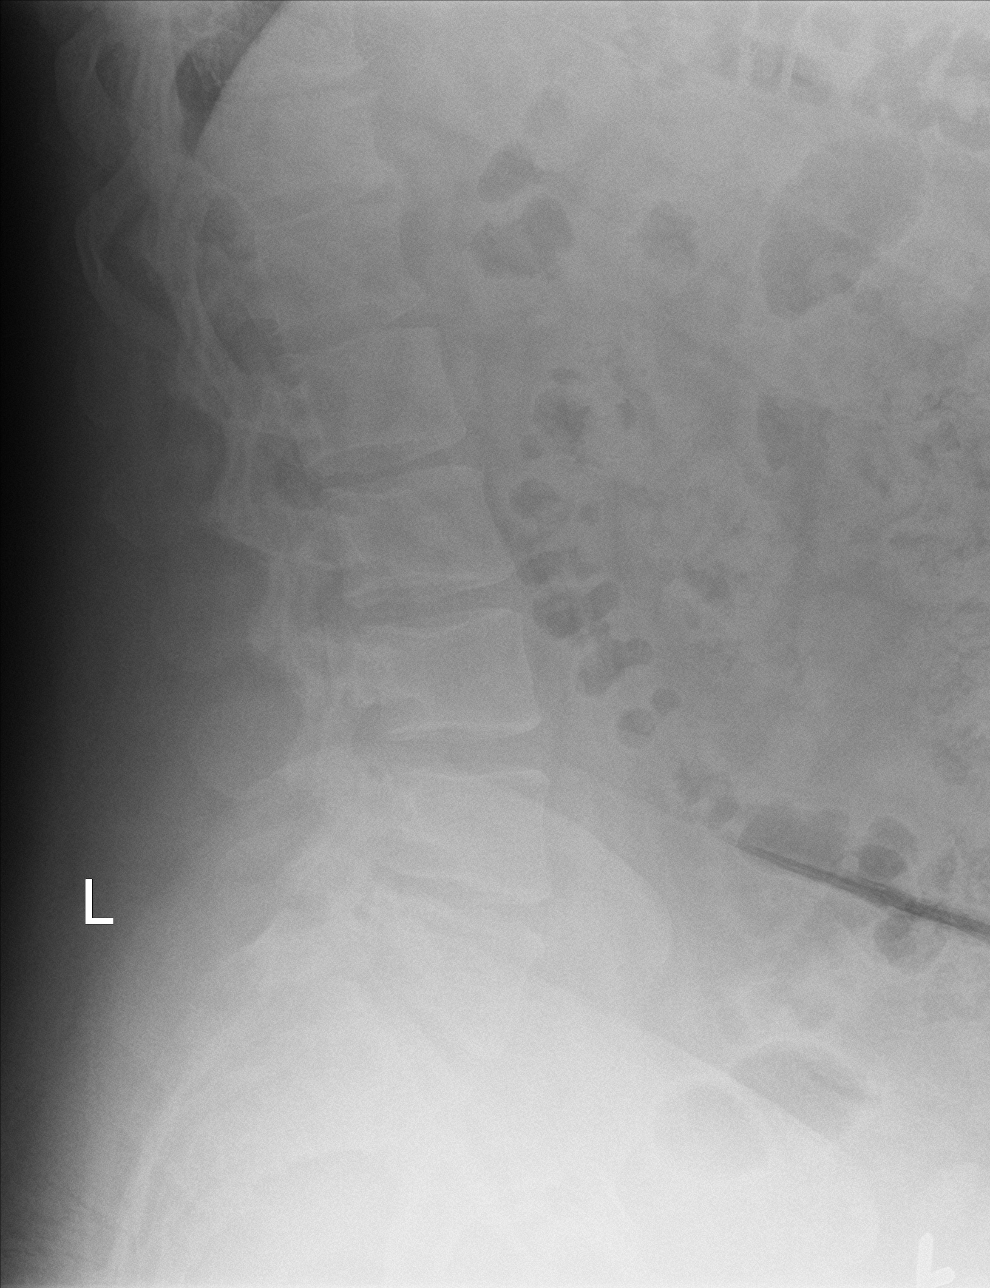

[4 of 4 positions shown; findings below may reference images not displayed]

FINDINGS: There is no evidence of lumbar spine fracture. Alignment is normal.
Intervertebral disc spaces are maintained.
IMPRESSION: Negative.

## 2018-03-30 IMAGING — DX DG THORACIC SPINE 2V
2 series · 2 of 2 positions shown · non-contrast
Comparison: Two-view chest 09/23/2016

CLINICAL DATA: Patient states that he has mid to lower back pain
since last night, no known injury

EXAM:
THORACIC SPINE 2 VIEWS

[t-spine ap]
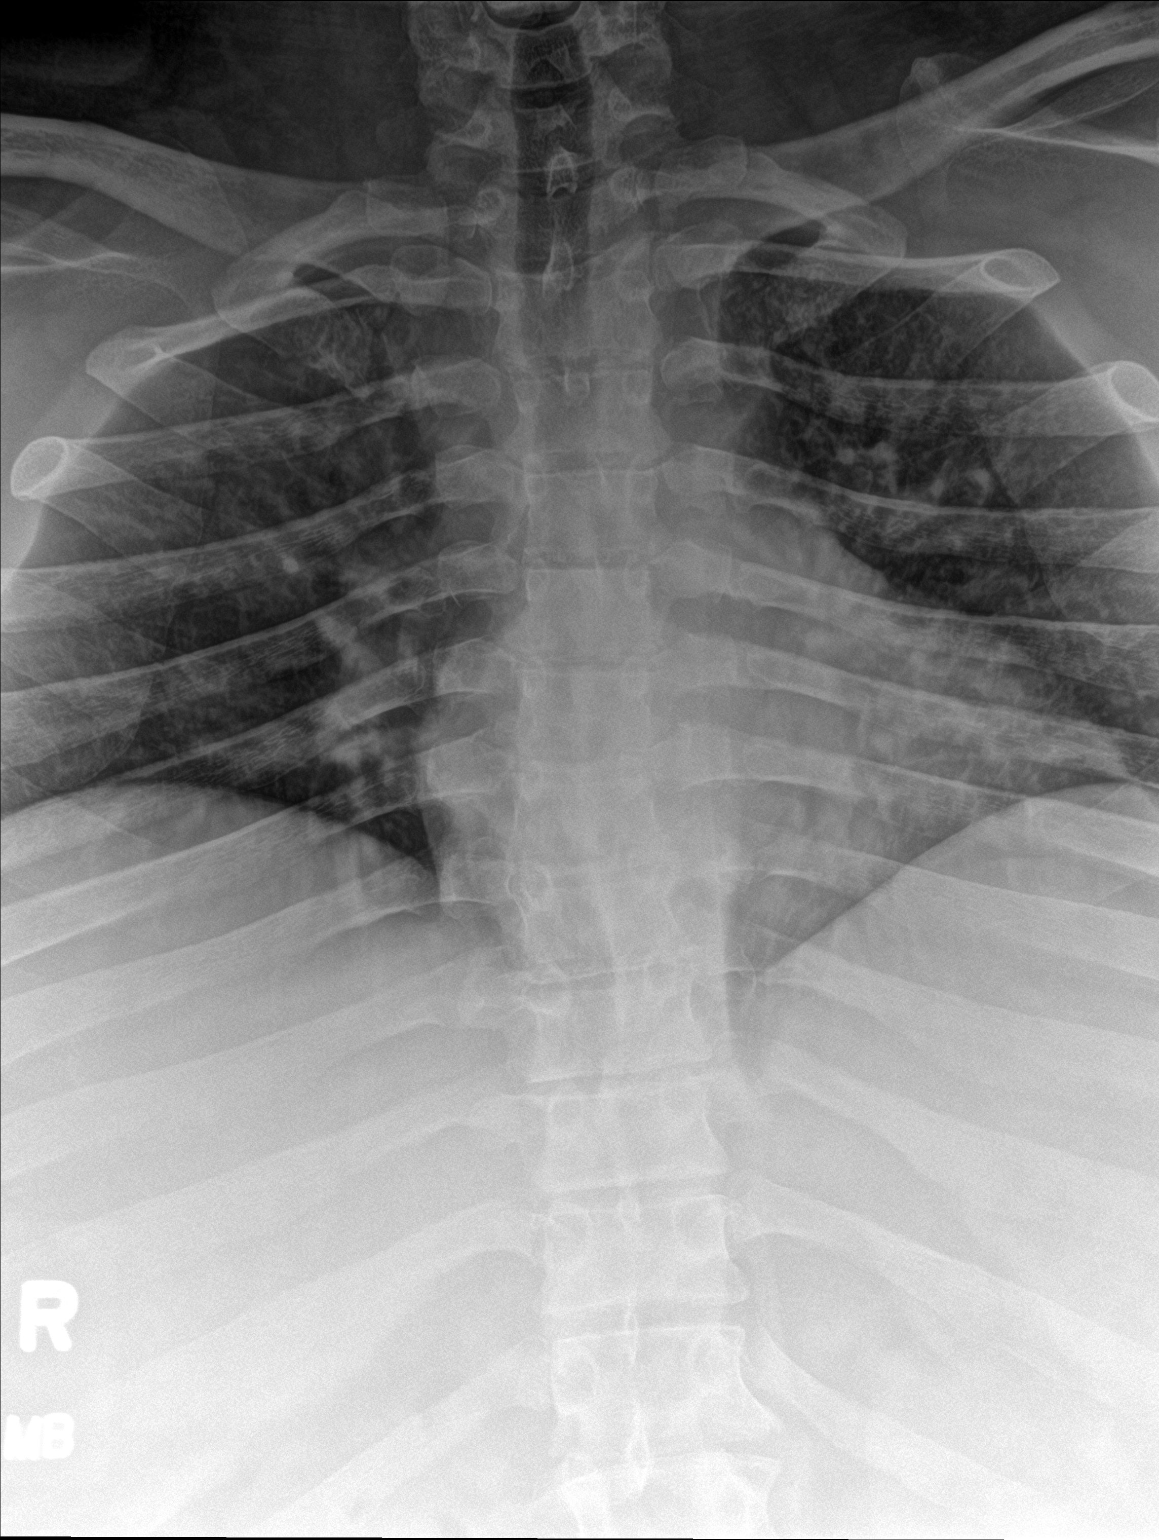

[t-spine lat]
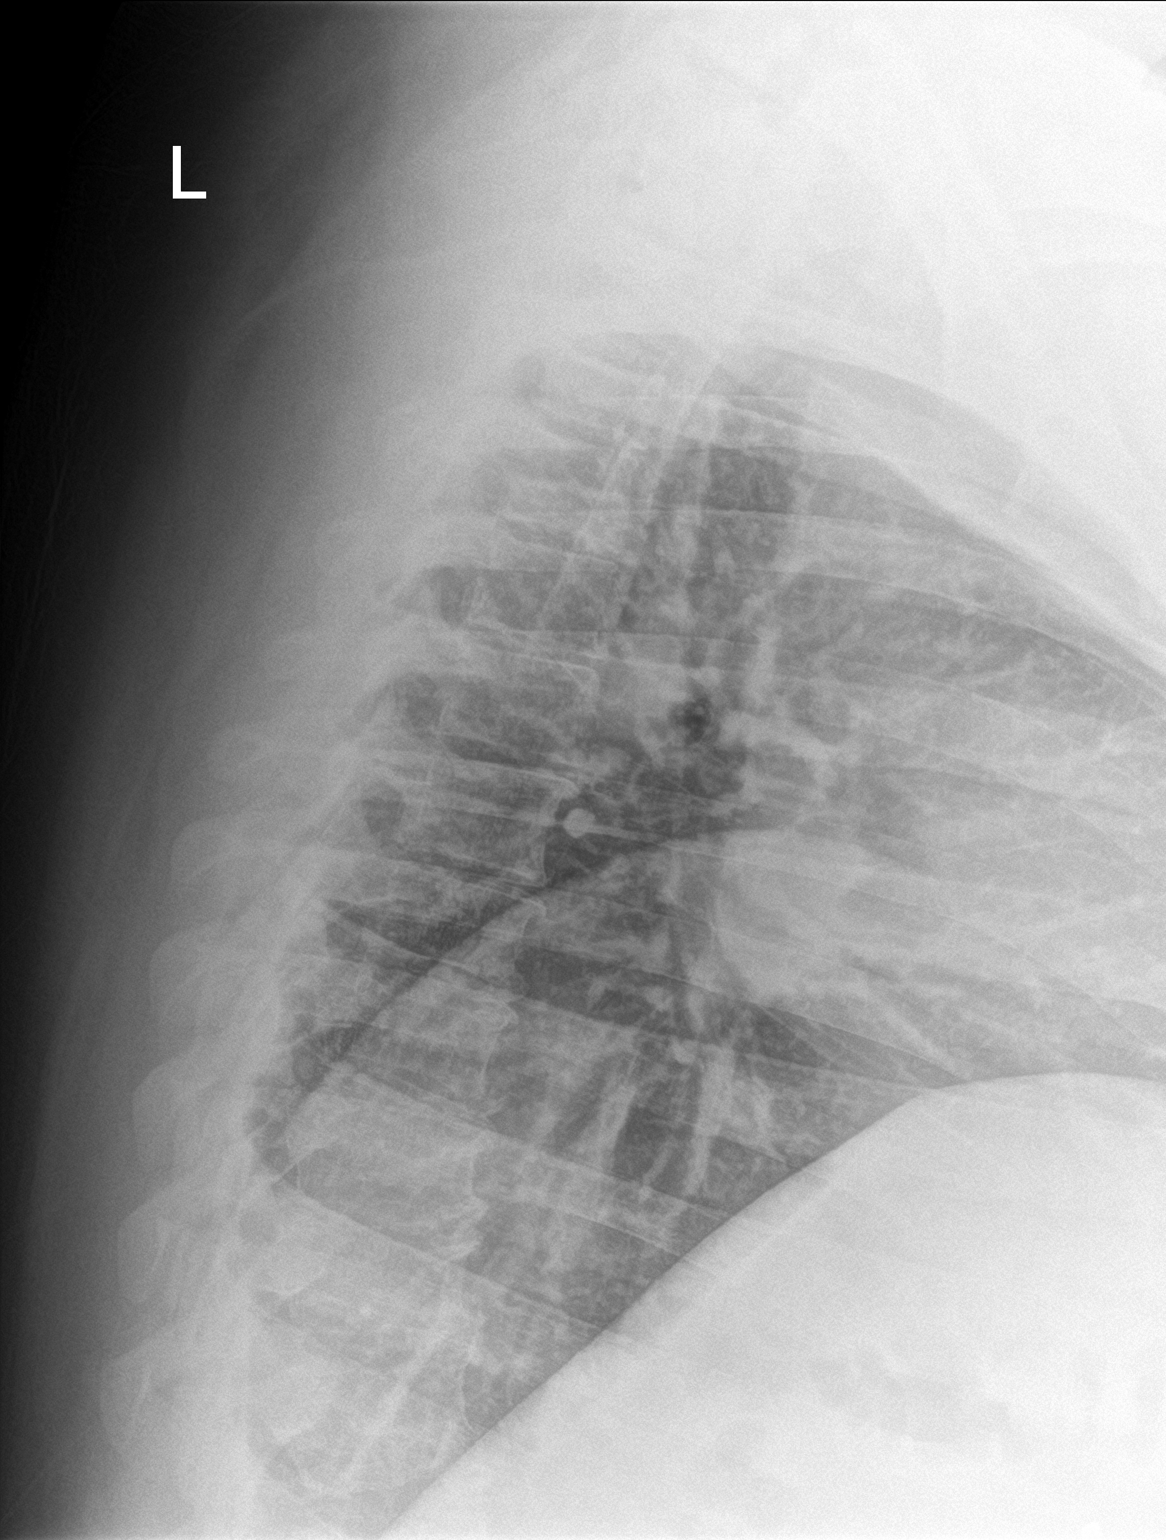

[2 of 2 positions shown; findings below may reference images not displayed]

FINDINGS: Normal alignment of the thoracic spine. Degenerative changes
throughout with narrowed interspaces and endplate hypertrophic
changes, most prominent in the mid and lower thoracic region. No
vertebral compression deformities. No focal bone lesions. No
paraspinal soft tissue swelling.
IMPRESSION: Degenerative changes in the thoracic spine. Normal alignment. No
acute displaced fractures identified.
